# Patient Record
Sex: Female | Born: 1995 | Race: White | Hispanic: No | Marital: Married | Smoking: Current every day smoker
Health system: Southern US, Community
[De-identification: ages and names within clinical notes are randomized; demographics above are authoritative.]

## PROBLEM LIST (undated history)

## (undated) DIAGNOSIS — Z8489 Family history of other specified conditions: Secondary | ICD-10-CM

## (undated) DIAGNOSIS — F419 Anxiety disorder, unspecified: Secondary | ICD-10-CM

## (undated) DIAGNOSIS — J189 Pneumonia, unspecified organism: Secondary | ICD-10-CM

## (undated) DIAGNOSIS — K589 Irritable bowel syndrome without diarrhea: Secondary | ICD-10-CM

## (undated) DIAGNOSIS — Z973 Presence of spectacles and contact lenses: Secondary | ICD-10-CM

## (undated) DIAGNOSIS — F32A Depression, unspecified: Secondary | ICD-10-CM

## (undated) DIAGNOSIS — R112 Nausea with vomiting, unspecified: Secondary | ICD-10-CM

## (undated) DIAGNOSIS — T753XXA Motion sickness, initial encounter: Secondary | ICD-10-CM

## (undated) DIAGNOSIS — F329 Major depressive disorder, single episode, unspecified: Secondary | ICD-10-CM

## (undated) DIAGNOSIS — Z9889 Other specified postprocedural states: Secondary | ICD-10-CM

## (undated) DIAGNOSIS — G43909 Migraine, unspecified, not intractable, without status migrainosus: Secondary | ICD-10-CM

## (undated) HISTORY — DX: Anxiety disorder, unspecified: F41.9

## (undated) HISTORY — PX: WISDOM TOOTH EXTRACTION: SHX21

---
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
## 2016-10-10 DIAGNOSIS — H6063 Unspecified chronic otitis externa, bilateral: Secondary | ICD-10-CM | POA: Diagnosis not present

## 2016-10-10 DIAGNOSIS — H903 Sensorineural hearing loss, bilateral: Secondary | ICD-10-CM | POA: Diagnosis not present

## 2016-10-16 DIAGNOSIS — H5213 Myopia, bilateral: Secondary | ICD-10-CM | POA: Diagnosis not present

## 2016-10-23 DIAGNOSIS — H903 Sensorineural hearing loss, bilateral: Secondary | ICD-10-CM | POA: Diagnosis not present

## 2017-04-19 DIAGNOSIS — R197 Diarrhea, unspecified: Secondary | ICD-10-CM | POA: Diagnosis not present

## 2017-04-19 DIAGNOSIS — R1084 Generalized abdominal pain: Secondary | ICD-10-CM | POA: Diagnosis not present

## 2017-04-19 DIAGNOSIS — A049 Bacterial intestinal infection, unspecified: Secondary | ICD-10-CM | POA: Diagnosis not present

## 2017-08-01 ENCOUNTER — Ambulatory Visit (INDEPENDENT_AMBULATORY_CARE_PROVIDER_SITE_OTHER): Payer: BLUE CROSS/BLUE SHIELD | Admitting: Obstetrics and Gynecology

## 2017-08-01 ENCOUNTER — Encounter: Payer: Self-pay | Admitting: Obstetrics and Gynecology

## 2017-08-01 VITALS — BP 104/70 | HR 85 | Ht 64.0 in | Wt 145.0 lb

## 2017-08-01 DIAGNOSIS — F329 Major depressive disorder, single episode, unspecified: Secondary | ICD-10-CM

## 2017-08-01 DIAGNOSIS — Z124 Encounter for screening for malignant neoplasm of cervix: Secondary | ICD-10-CM | POA: Diagnosis not present

## 2017-08-01 DIAGNOSIS — Z01419 Encounter for gynecological examination (general) (routine) without abnormal findings: Secondary | ICD-10-CM | POA: Diagnosis not present

## 2017-08-01 DIAGNOSIS — Z113 Encounter for screening for infections with a predominantly sexual mode of transmission: Secondary | ICD-10-CM

## 2017-08-01 DIAGNOSIS — F419 Anxiety disorder, unspecified: Secondary | ICD-10-CM

## 2017-08-01 DIAGNOSIS — Z3041 Encounter for surveillance of contraceptive pills: Secondary | ICD-10-CM

## 2017-08-01 DIAGNOSIS — F32A Depression, unspecified: Secondary | ICD-10-CM

## 2017-08-01 MED ORDER — NORGESTIM-ETH ESTRAD TRIPHASIC 0.18/0.215/0.25 MG-35 MCG PO TABS: 1.0000 | ORAL_TABLET | Freq: Every day | ORAL | 4 refills | Status: DC

## 2017-08-01 MED ORDER — PAROXETINE HCL 10 MG PO TABS: ORAL_TABLET | ORAL | 1 refills | Status: DC

## 2017-08-01 NOTE — Progress Notes (Signed)
PCP:  Patient, No Pcp Per   Chief Complaint  Patient presents with  . Gynecologic Exam    birthcontol refill/discuss options  . Anxiety     HPI:      Ms. Michele Bell is a 21 y.o. No obstetric history on file. who LMP was Patient's last menstrual period was 06/01/2017., presents today for her NP annual examination.  Her menses are Q3 packs of cont dosing OCPs due to menometrorrhagia, lasting 6 days.  Dysmenorrhea moderate, occurring first 1-2 days of flow. NSAIDs with minimal relief. She does not have intermenstrual bleeding if she isn't late/doesn't miss OCPs.  Sex activity: single partner, contraception - OCP (estrogen/progesterone).  Hx of STDs: none  There is no FH of breast cancer. There is no FH of ovarian cancer. The patient does not do self-breast exams.  Tobacco use: The patient denies current or previous tobacco use. Alcohol use: glass of wine with dinner No drug use.  Exercise: moderately active  She does get adequate calcium and Vitamin D in her diet.  Gardasil unsure if done.  She has a hx of anxiety with some depression sx since high school. She has been treated with xanax and lorazepam but pt doesn't like the drug hangover effect of them. She has daily sx with frequent panic attacks and would rather have a medication to prevent sx. She notes feeling nervous, having excessive worry, irritability, feeling afraid something bad will happen, as well as depression sx of feeling down, too much sleeping. No FH anxiety/depression except in mom who has cancer. Doesn't usually have SI unless really upset during panic attacks.   Past Medical History:  Diagnosis Date  . Anxiety     History reviewed. No pertinent surgical history.  Family History  Problem Relation Age of Onset  . Cancer - Prostate Maternal Grandfather 74  . Colon cancer Other   . Lung cancer Mother 72       smoker  . Colon cancer Other     Social History   Socioeconomic History  .  Marital status: Single    Spouse name: Not on file  . Number of children: Not on file  . Years of education: Not on file  . Highest education level: Not on file  Social Needs  . Financial resource strain: Not on file  . Food insecurity - worry: Not on file  . Food insecurity - inability: Not on file  . Transportation needs - medical: Not on file  . Transportation needs - non-medical: Not on file  Occupational History  . Not on file  Tobacco Use  . Smoking status: Never Smoker  . Smokeless tobacco: Never Used  Substance and Sexual Activity  . Alcohol use: Yes  . Drug use: No  . Sexual activity: Yes    Partners: Male    Birth control/protection: Pill  Other Topics Concern  . Not on file  Social History Narrative  . Not on file    Current Meds  Medication Sig  . ALPRAZolam (XANAX) 0.25 MG tablet Take 0.25 mg at bedtime as needed by mouth for anxiety.  . Norgestimate-Ethinyl Estradiol Triphasic (TRI-SPRINTEC) 0.18/0.215/0.25 MG-35 MCG tablet Take 1 tablet daily by mouth. CONTINUOUS DOSING  . [DISCONTINUED] TRI-SPRINTEC 0.18/0.215/0.25 MG-35 MCG tablet      ROS:  Review of Systems  Constitutional: Negative for fatigue, fever and unexpected weight change.  Respiratory: Negative for cough, shortness of breath and wheezing.   Cardiovascular: Negative for chest pain, palpitations and leg  swelling.  Gastrointestinal: Negative for blood in stool, constipation, diarrhea, nausea and vomiting.  Endocrine: Negative for cold intolerance, heat intolerance and polyuria.  Genitourinary: Positive for vaginal discharge. Negative for dyspareunia, dysuria, flank pain, frequency, genital sores, hematuria, menstrual problem, pelvic pain, urgency, vaginal bleeding and vaginal pain.  Musculoskeletal: Negative for back pain, joint swelling and myalgias.  Skin: Negative for rash.  Neurological: Negative for dizziness, syncope, light-headedness, numbness and headaches.  Hematological: Negative for  adenopathy.  Psychiatric/Behavioral: Positive for agitation and dysphoric mood. Negative for confusion, sleep disturbance and suicidal ideas. The patient is nervous/anxious.      Objective: BP 104/70 (BP Location: Left Arm, Patient Position: Sitting, Cuff Size: Normal)   Pulse 85   Ht 5\' 4"  (1.626 m)   Wt 145 lb (65.8 kg)   LMP 06/01/2017   BMI 24.89 kg/m    Physical Exam  Constitutional: She is oriented to person, place, and time. She appears well-developed and well-nourished.  Genitourinary: Vagina normal and uterus normal. There is no rash or tenderness on the right labia. There is no rash or tenderness on the left labia. No erythema or tenderness in the vagina. No vaginal discharge found. Right adnexum does not display mass and does not display tenderness. Left adnexum does not display mass and does not display tenderness. Cervix does not exhibit motion tenderness or polyp. Uterus is not enlarged or tender.  Neck: Normal range of motion. No thyromegaly present.  Cardiovascular: Normal rate, regular rhythm and normal heart sounds.  No murmur heard. Pulmonary/Chest: Effort normal and breath sounds normal. Right breast exhibits no mass, no nipple discharge, no skin change and no tenderness. Left breast exhibits no mass, no nipple discharge, no skin change and no tenderness.  Abdominal: Soft. There is no tenderness. There is no guarding.  Musculoskeletal: Normal range of motion.  Neurological: She is alert and oriented to person, place, and time. No cranial nerve deficit.  Psychiatric: She has a normal mood and affect. Her behavior is normal.  Vitals reviewed.  RESULTS:  GAD-7=22 PHQ-9=14  Assessment/Plan: Encounter for annual routine gynecological examination  Cervical cancer screening - Plan: IGP,CtNgTv,rfx Aptima HPV ASCU  Screening for STD (sexually transmitted disease) - Plan: IGP,CtNgTv,rfx Aptima HPV ASCU  Encounter for surveillance of contraceptive pills - OCP RF.  -  Plan: Norgestimate-Ethinyl Estradiol Triphasic (TRI-SPRINTEC) 0.18/0.215/0.25 MG-35 MCG tablet  Anxiety and depression - Start paxil. Rx eRxd. RTO in 7 wks for f/u/sooner prn.  - Plan: PARoxetine (PAXIL) 10 MG tablet  Meds ordered this encounter  Medications  . DISCONTD: TRI-SPRINTEC 0.18/0.215/0.25 MG-35 MCG tablet    Refill:  5  . ALPRAZolam (XANAX) 0.25 MG tablet    Sig: Take 0.25 mg at bedtime as needed by mouth for anxiety.  Marland Kitchen. PARoxetine (PAXIL) 10 MG tablet    Sig: Take 1/2 tablet daily for 6 days, then 1 tablet daily    Dispense:  30 tablet    Refill:  1  . Norgestimate-Ethinyl Estradiol Triphasic (TRI-SPRINTEC) 0.18/0.215/0.25 MG-35 MCG tablet    Sig: Take 1 tablet daily by mouth. CONTINUOUS DOSING    Dispense:  3 Package    Refill:  4             GYN counsel use and side effects of OCP's, adequate intake of calcium and vitamin D, diet and exercise, as well as Gardasil handout given to pt     F/U  Return in about 7 weeks (around 09/19/2017) for anxiety f/u.  Kyrus Hyde B. Jahzier Villalon, PA-C  08/01/2017 4:21 PM

## 2017-08-01 NOTE — Patient Instructions (Signed)
I value your feedback and entrusting us with your care. If you get a [facility] patient survey, I would appreciate you taking the time to let us know about your experience today. Thank you! 

## 2017-08-06 LAB — IGP,CTNGTV,RFX APTIMA HPV ASCU
Chlamydia, Nuc. Acid Amp: NEGATIVE
Gonococcus, Nuc. Acid Amp: NEGATIVE
PAP Smear Comment: 0
Trich vag by NAA: NEGATIVE

## 2017-09-23 ENCOUNTER — Encounter: Payer: Self-pay | Admitting: Obstetrics and Gynecology

## 2017-09-23 ENCOUNTER — Ambulatory Visit: Payer: BLUE CROSS/BLUE SHIELD | Admitting: Obstetrics and Gynecology

## 2017-09-23 VITALS — BP 100/60 | HR 107 | Ht 64.0 in | Wt 146.0 lb

## 2017-09-23 DIAGNOSIS — B3731 Acute candidiasis of vulva and vagina: Secondary | ICD-10-CM

## 2017-09-23 DIAGNOSIS — B373 Candidiasis of vulva and vagina: Secondary | ICD-10-CM | POA: Diagnosis not present

## 2017-09-23 DIAGNOSIS — F329 Major depressive disorder, single episode, unspecified: Secondary | ICD-10-CM

## 2017-09-23 DIAGNOSIS — F419 Anxiety disorder, unspecified: Secondary | ICD-10-CM

## 2017-09-23 DIAGNOSIS — F32A Depression, unspecified: Secondary | ICD-10-CM

## 2017-09-23 LAB — POCT WET PREP WITH KOH
CLUE CELLS WET PREP PER HPF POC: NEGATIVE
KOH Prep POC: NEGATIVE
TRICHOMONAS UA: NEGATIVE
YEAST WET PREP PER HPF POC: POSITIVE

## 2017-09-23 MED ORDER — TERCONAZOLE 0.4 % VA CREA: 1.0000 | TOPICAL_CREAM | Freq: Every day | VAGINAL | 0 refills | Status: DC

## 2017-09-23 MED ORDER — PAROXETINE HCL 20 MG PO TABS: 20.0000 mg | ORAL_TABLET | Freq: Every day | ORAL | 0 refills | Status: DC

## 2017-09-23 NOTE — Patient Instructions (Signed)
I value your feedback and entrusting us with your care. If you get a  patient survey, I would appreciate you taking the time to let us know about your experience today. Thank you! 

## 2017-09-23 NOTE — Progress Notes (Signed)
Chief Complaint  Patient presents with  . Follow-up    HPI:      Ms. Michele Bell is a 22 y.o. [id number] who LMP was Patient's last menstrual period was 08/26/2017., presents today for anxiety/depression f/u from 11/18 annual. Pt was started on paxil 10 mg daily for panic attacks, irritability, worry, and dread. Pt states she is no longer having panic attacks. She will get extreme anxiety episodes periodically but they don't progress to a panic attack. Sx are improving but aren't resolved. Pt denies any side effects.   08/01/17 NOTE: She has a hx of anxiety with some depression sx since high school. She has been treated with xanax and lorazepam but pt doesn't like the drug hangover effect of them. She has daily sx with frequent panic attacks and would rather have a medication to prevent sx. She notes feeling nervous, having excessive worry, irritability, feeling afraid something bad will happen, as well as depression sx of feeling down, too much sleeping. No FH anxiety/depression except in mom who has cancer. Doesn't usually have SI unless really upset during panic attacks.  Pt also notes vaginal discomfort after sex in the perineal area, as well as increased d/c, mild odor. Sx for the past month. No recent abx use. No meds to treat. No new sex partners.   Past Medical History:  Diagnosis Date  . Anxiety     History reviewed. No pertinent surgical history.  Family History  Problem Relation Age of Onset  . Cancer - Prostate Maternal Grandfather [id number]  . Colon cancer Other   . Lung cancer Mother 1641       smoker  . Colon cancer Other     Social History   Socioeconomic History  . Marital status: Single    Spouse name: Not on file  . Number of children: Not on file  . Years of education: Not on file  . Highest education level: Not on file  Social Needs  . Financial resource strain: Not on file  . Food insecurity - worry: Not on file  . Food insecurity - inability: Not on  file  . Transportation needs - medical: Not on file  . Transportation needs - non-medical: Not on file  Occupational History  . Not on file  Tobacco Use  . Smoking status: Never Smoker  . Smokeless tobacco: Never Used  Substance and Sexual Activity  . Alcohol use: Yes  . Drug use: No  . Sexual activity: Yes    Partners: Male    Birth control/protection: Pill  Other Topics Concern  . Not on file  Social History Narrative  . Not on file     Current Outpatient Medications:  .  ALPRAZolam (XANAX) 0.25 MG tablet, Take 0.25 mg at bedtime as needed by mouth for anxiety., Disp: , Rfl:  .  Norgestimate-Ethinyl Estradiol Triphasic (TRI-SPRINTEC) 0.18/0.215/0.25 MG-35 MCG tablet, Take 1 tablet daily by mouth. CONTINUOUS DOSING, Disp: 3 Package, Rfl: 4 .  PARoxetine (PAXIL) 20 MG tablet, Take 1 tablet (20 mg total) by mouth daily., Disp: 30 tablet, Rfl: 0 .  terconazole (TERAZOL 7) 0.4 % vaginal cream, Place 1 applicator vaginally at bedtime., Disp: 45 g, Rfl: 0   ROS:  Review of Systems  Constitutional: Positive for fatigue. Negative for fever.  Gastrointestinal: Negative for blood in stool, constipation, diarrhea, nausea and vomiting.  Genitourinary: Positive for dyspareunia and vaginal discharge. Negative for dysuria, flank pain, frequency, hematuria, urgency, vaginal bleeding and vaginal pain.  Musculoskeletal:  Negative for back pain.  Skin: Negative for rash.  Psychiatric/Behavioral: Positive for dysphoric mood. Negative for agitation and confusion. The patient is not hyperactive.      OBJECTIVE:   Vitals:  BP 100/60   Pulse (!) 107   Ht 5\' 4"  (1.626 m)   Wt 146 lb (66.2 kg)   LMP 08/26/2017   BMI 25.06 kg/m   Physical Exam  Constitutional: She is oriented to person, place, and time and well-developed, well-nourished, and in no distress. Vital signs are normal.  Genitourinary: Vagina normal, uterus normal, cervix normal, right adnexa normal and left adnexa normal. Uterus  is not enlarged. Cervix exhibits no motion tenderness and no tenderness. Right adnexum displays no mass and no tenderness. Left adnexum displays no mass and no tenderness. Vulva exhibits erythema, lesion, rash and tenderness. Vulva exhibits no exudate. Vagina exhibits no lesion.  Genitourinary Comments: FISSURES BILAT PERINEAL AREA; ERYTHEMATOUS VESTIBULE  Neurological: She is oriented to person, place, and time.    Results: Results for orders placed or performed in visit on 09/23/17 (from the past 24 hour(s))  POCT Wet Prep with KOH     Status: Abnormal   Collection Time: 09/23/17  2:51 PM  Result Value Ref Range   Trichomonas, UA Negative    Clue Cells Wet Prep HPF POC NEG    Epithelial Wet Prep HPF POC  Few, Moderate, Many, Too numerous to count   Yeast Wet Prep HPF POC POS    Bacteria Wet Prep HPF POC  Few   RBC Wet Prep HPF POC     WBC Wet Prep HPF POC     KOH Prep POC Negative Negative   GAD-7=11 (was 22) PHQ-9=8 (was 14)  Assessment/Plan: Candidal vaginitis - Pos wet prep/exam.  Rx terazol. F/u prn. - Plan: terconazole (TERAZOL 7) 0.4 % vaginal cream, POCT Wet Prep with KOH  Anxiety and depression - Sx improved with paxil 10 mg but not resolved. No side effects. Increase to 20 mg dose. Rx eRxd. Pt to f/u via phone in 4 wks with sx.  - Plan: PARoxetine (PAXIL) 20 MG tablet    Meds ordered this encounter  Medications  . PARoxetine (PAXIL) 20 MG tablet    Sig: Take 1 tablet (20 mg total) by mouth daily.    Dispense:  30 tablet    Refill:  0  . terconazole (TERAZOL 7) 0.4 % vaginal cream    Sig: Place 1 applicator vaginally at bedtime.    Dispense:  45 g    Refill:  0      Return if symptoms worsen or fail to improve.  Michele Bell B. Toni Demo, PA-C 09/23/2017 2:53 PM

## 2017-10-24 ENCOUNTER — Other Ambulatory Visit: Payer: Self-pay | Admitting: Obstetrics and Gynecology

## 2017-10-24 ENCOUNTER — Encounter: Payer: Self-pay | Admitting: Obstetrics and Gynecology

## 2017-10-24 DIAGNOSIS — F329 Major depressive disorder, single episode, unspecified: Secondary | ICD-10-CM

## 2017-10-24 DIAGNOSIS — F32A Depression, unspecified: Secondary | ICD-10-CM

## 2017-10-24 DIAGNOSIS — F419 Anxiety disorder, unspecified: Principal | ICD-10-CM

## 2017-10-24 MED ORDER — PAROXETINE HCL 20 MG PO TABS: 20.0000 mg | ORAL_TABLET | Freq: Every day | ORAL | 2 refills | Status: DC

## 2017-10-24 NOTE — Telephone Encounter (Signed)
Please advise for refills. Thank you! 

## 2018-01-07 DIAGNOSIS — R51 Headache: Secondary | ICD-10-CM | POA: Diagnosis not present

## 2018-01-07 DIAGNOSIS — M542 Cervicalgia: Secondary | ICD-10-CM | POA: Diagnosis not present

## 2018-01-07 DIAGNOSIS — M5411 Radiculopathy, occipito-atlanto-axial region: Secondary | ICD-10-CM | POA: Diagnosis not present

## 2018-01-07 DIAGNOSIS — M5412 Radiculopathy, cervical region: Secondary | ICD-10-CM | POA: Diagnosis not present

## 2018-01-08 DIAGNOSIS — M542 Cervicalgia: Secondary | ICD-10-CM | POA: Diagnosis not present

## 2018-01-08 DIAGNOSIS — M5411 Radiculopathy, occipito-atlanto-axial region: Secondary | ICD-10-CM | POA: Diagnosis not present

## 2018-01-08 DIAGNOSIS — M5412 Radiculopathy, cervical region: Secondary | ICD-10-CM | POA: Diagnosis not present

## 2018-01-08 DIAGNOSIS — R51 Headache: Secondary | ICD-10-CM | POA: Diagnosis not present

## 2018-01-13 DIAGNOSIS — R51 Headache: Secondary | ICD-10-CM | POA: Diagnosis not present

## 2018-01-13 DIAGNOSIS — M5411 Radiculopathy, occipito-atlanto-axial region: Secondary | ICD-10-CM | POA: Diagnosis not present

## 2018-01-13 DIAGNOSIS — M542 Cervicalgia: Secondary | ICD-10-CM | POA: Diagnosis not present

## 2018-01-13 DIAGNOSIS — M5412 Radiculopathy, cervical region: Secondary | ICD-10-CM | POA: Diagnosis not present

## 2018-01-14 DIAGNOSIS — M542 Cervicalgia: Secondary | ICD-10-CM | POA: Diagnosis not present

## 2018-01-14 DIAGNOSIS — M5411 Radiculopathy, occipito-atlanto-axial region: Secondary | ICD-10-CM | POA: Diagnosis not present

## 2018-01-14 DIAGNOSIS — M5412 Radiculopathy, cervical region: Secondary | ICD-10-CM | POA: Diagnosis not present

## 2018-01-14 DIAGNOSIS — R51 Headache: Secondary | ICD-10-CM | POA: Diagnosis not present

## 2018-01-15 DIAGNOSIS — M542 Cervicalgia: Secondary | ICD-10-CM | POA: Diagnosis not present

## 2018-01-15 DIAGNOSIS — R51 Headache: Secondary | ICD-10-CM | POA: Diagnosis not present

## 2018-01-15 DIAGNOSIS — M5411 Radiculopathy, occipito-atlanto-axial region: Secondary | ICD-10-CM | POA: Diagnosis not present

## 2018-01-15 DIAGNOSIS — M5412 Radiculopathy, cervical region: Secondary | ICD-10-CM | POA: Diagnosis not present

## 2018-01-16 ENCOUNTER — Encounter: Payer: Self-pay | Admitting: Obstetrics and Gynecology

## 2018-01-16 DIAGNOSIS — H938X3 Other specified disorders of ear, bilateral: Secondary | ICD-10-CM | POA: Diagnosis not present

## 2018-01-17 ENCOUNTER — Telehealth: Payer: Self-pay | Admitting: Obstetrics and Gynecology

## 2018-01-17 DIAGNOSIS — F410 Panic disorder [episodic paroxysmal anxiety] without agoraphobia: Secondary | ICD-10-CM | POA: Diagnosis not present

## 2018-01-17 NOTE — Telephone Encounter (Signed)
Pls let pt know that since we didn't see her for sx, I can't write a note. I know that is frustrating for pt, but she'll just have to explain to work that she wasn't able to be seen by PCP and that minute clinic note will have to do. Sorry.

## 2018-01-17 NOTE — Telephone Encounter (Signed)
LVM for patient to call me back 

## 2018-01-17 NOTE — Telephone Encounter (Signed)
Patient is calling needing an work note. Please advise. Patient states she sent message thru mychart directly to [employer].

## 2018-01-20 DIAGNOSIS — M5411 Radiculopathy, occipito-atlanto-axial region: Secondary | ICD-10-CM | POA: Diagnosis not present

## 2018-01-20 DIAGNOSIS — R51 Headache: Secondary | ICD-10-CM | POA: Diagnosis not present

## 2018-01-20 DIAGNOSIS — M5412 Radiculopathy, cervical region: Secondary | ICD-10-CM | POA: Diagnosis not present

## 2018-01-20 DIAGNOSIS — M542 Cervicalgia: Secondary | ICD-10-CM | POA: Diagnosis not present

## 2018-01-21 DIAGNOSIS — M5411 Radiculopathy, occipito-atlanto-axial region: Secondary | ICD-10-CM | POA: Diagnosis not present

## 2018-01-21 DIAGNOSIS — M5412 Radiculopathy, cervical region: Secondary | ICD-10-CM | POA: Diagnosis not present

## 2018-01-21 DIAGNOSIS — R51 Headache: Secondary | ICD-10-CM | POA: Diagnosis not present

## 2018-01-21 DIAGNOSIS — M542 Cervicalgia: Secondary | ICD-10-CM | POA: Diagnosis not present

## 2018-01-27 DIAGNOSIS — M5411 Radiculopathy, occipito-atlanto-axial region: Secondary | ICD-10-CM | POA: Diagnosis not present

## 2018-01-27 DIAGNOSIS — M5412 Radiculopathy, cervical region: Secondary | ICD-10-CM | POA: Diagnosis not present

## 2018-01-27 DIAGNOSIS — M542 Cervicalgia: Secondary | ICD-10-CM | POA: Diagnosis not present

## 2018-01-27 DIAGNOSIS — R51 Headache: Secondary | ICD-10-CM | POA: Diagnosis not present

## 2018-01-29 DIAGNOSIS — M5411 Radiculopathy, occipito-atlanto-axial region: Secondary | ICD-10-CM | POA: Diagnosis not present

## 2018-01-29 DIAGNOSIS — R51 Headache: Secondary | ICD-10-CM | POA: Diagnosis not present

## 2018-01-29 DIAGNOSIS — M5412 Radiculopathy, cervical region: Secondary | ICD-10-CM | POA: Diagnosis not present

## 2018-01-29 DIAGNOSIS — M542 Cervicalgia: Secondary | ICD-10-CM | POA: Diagnosis not present

## 2018-02-03 DIAGNOSIS — M5411 Radiculopathy, occipito-atlanto-axial region: Secondary | ICD-10-CM | POA: Diagnosis not present

## 2018-02-03 DIAGNOSIS — R51 Headache: Secondary | ICD-10-CM | POA: Diagnosis not present

## 2018-02-03 DIAGNOSIS — M5412 Radiculopathy, cervical region: Secondary | ICD-10-CM | POA: Diagnosis not present

## 2018-02-03 DIAGNOSIS — M542 Cervicalgia: Secondary | ICD-10-CM | POA: Diagnosis not present

## 2018-02-04 DIAGNOSIS — M5411 Radiculopathy, occipito-atlanto-axial region: Secondary | ICD-10-CM | POA: Diagnosis not present

## 2018-02-04 DIAGNOSIS — M5412 Radiculopathy, cervical region: Secondary | ICD-10-CM | POA: Diagnosis not present

## 2018-02-04 DIAGNOSIS — M542 Cervicalgia: Secondary | ICD-10-CM | POA: Diagnosis not present

## 2018-02-04 DIAGNOSIS — R51 Headache: Secondary | ICD-10-CM | POA: Diagnosis not present

## 2018-07-10 ENCOUNTER — Encounter: Payer: Self-pay | Admitting: Obstetrics and Gynecology

## 2018-07-10 ENCOUNTER — Other Ambulatory Visit: Payer: Self-pay | Admitting: Obstetrics and Gynecology

## 2018-07-10 DIAGNOSIS — F419 Anxiety disorder, unspecified: Secondary | ICD-10-CM

## 2018-07-10 DIAGNOSIS — F32A Depression, unspecified: Secondary | ICD-10-CM

## 2018-07-10 DIAGNOSIS — F329 Major depressive disorder, single episode, unspecified: Secondary | ICD-10-CM

## 2018-07-10 DIAGNOSIS — Z3041 Encounter for surveillance of contraceptive pills: Secondary | ICD-10-CM

## 2018-07-10 MED ORDER — PAROXETINE HCL 20 MG PO TABS: 20.0000 mg | ORAL_TABLET | Freq: Every day | ORAL | 0 refills | Status: DC

## 2018-07-10 MED ORDER — NORGESTIM-ETH ESTRAD TRIPHASIC 0.18/0.215/0.25 MG-35 MCG PO TABS: 1.0000 | ORAL_TABLET | Freq: Every day | ORAL | 0 refills | Status: DC

## 2018-07-10 NOTE — Progress Notes (Signed)
Rx RF till annual. Moved to [place].

## 2018-08-13 DIAGNOSIS — R232 Flushing: Secondary | ICD-10-CM | POA: Diagnosis not present

## 2018-08-13 DIAGNOSIS — R61 Generalized hyperhidrosis: Secondary | ICD-10-CM | POA: Diagnosis not present

## 2018-08-13 DIAGNOSIS — Z131 Encounter for screening for diabetes mellitus: Secondary | ICD-10-CM | POA: Diagnosis not present

## 2018-08-13 DIAGNOSIS — Z113 Encounter for screening for infections with a predominantly sexual mode of transmission: Secondary | ICD-10-CM | POA: Diagnosis not present

## 2018-08-13 DIAGNOSIS — F321 Major depressive disorder, single episode, moderate: Secondary | ICD-10-CM | POA: Diagnosis not present

## 2018-08-13 DIAGNOSIS — E669 Obesity, unspecified: Secondary | ICD-10-CM | POA: Diagnosis not present

## 2018-08-13 DIAGNOSIS — Z3009 Encounter for other general counseling and advice on contraception: Secondary | ICD-10-CM | POA: Diagnosis not present

## 2018-08-13 DIAGNOSIS — Z0000 Encounter for general adult medical examination without abnormal findings: Secondary | ICD-10-CM | POA: Diagnosis not present

## 2018-08-19 DIAGNOSIS — J029 Acute pharyngitis, unspecified: Secondary | ICD-10-CM | POA: Diagnosis not present

## 2018-08-29 DIAGNOSIS — F321 Major depressive disorder, single episode, moderate: Secondary | ICD-10-CM | POA: Diagnosis not present

## 2018-08-29 DIAGNOSIS — Z0000 Encounter for general adult medical examination without abnormal findings: Secondary | ICD-10-CM | POA: Diagnosis not present

## 2018-08-29 DIAGNOSIS — E663 Overweight: Secondary | ICD-10-CM | POA: Diagnosis not present

## 2018-08-29 DIAGNOSIS — Z3041 Encounter for surveillance of contraceptive pills: Secondary | ICD-10-CM | POA: Diagnosis not present

## 2018-09-13 DIAGNOSIS — S92505A Nondisplaced unspecified fracture of left lesser toe(s), initial encounter for closed fracture: Secondary | ICD-10-CM | POA: Diagnosis not present

## 2018-09-18 DIAGNOSIS — F410 Panic disorder [episodic paroxysmal anxiety] without agoraphobia: Secondary | ICD-10-CM | POA: Diagnosis not present

## 2018-10-02 DIAGNOSIS — F410 Panic disorder [episodic paroxysmal anxiety] without agoraphobia: Secondary | ICD-10-CM | POA: Diagnosis not present

## 2018-10-16 DIAGNOSIS — F410 Panic disorder [episodic paroxysmal anxiety] without agoraphobia: Secondary | ICD-10-CM | POA: Diagnosis not present

## 2018-10-18 DIAGNOSIS — R6883 Chills (without fever): Secondary | ICD-10-CM | POA: Diagnosis not present

## 2018-10-18 DIAGNOSIS — B9789 Other viral agents as the cause of diseases classified elsewhere: Secondary | ICD-10-CM | POA: Diagnosis not present

## 2018-10-18 DIAGNOSIS — J069 Acute upper respiratory infection, unspecified: Secondary | ICD-10-CM | POA: Diagnosis not present

## 2018-10-18 DIAGNOSIS — R110 Nausea: Secondary | ICD-10-CM | POA: Diagnosis not present

## 2018-11-07 ENCOUNTER — Emergency Department
Admission: EM | Admit: 2018-11-07 | Discharge: 2018-11-08 | Disposition: A | Discharge status: Home / Self Care | Payer: BLUE CROSS/BLUE SHIELD | Attending: Emergency Medicine | Admitting: Emergency Medicine

## 2018-11-07 ENCOUNTER — Encounter: Payer: Self-pay | Admitting: Emergency Medicine

## 2018-11-07 ENCOUNTER — Emergency Department: Payer: BLUE CROSS/BLUE SHIELD

## 2018-11-07 DIAGNOSIS — F419 Anxiety disorder, unspecified: Secondary | ICD-10-CM | POA: Diagnosis not present

## 2018-11-07 DIAGNOSIS — T07XXXA Unspecified multiple injuries, initial encounter: Secondary | ICD-10-CM | POA: Diagnosis not present

## 2018-11-07 DIAGNOSIS — Y929 Unspecified place or not applicable: Secondary | ICD-10-CM | POA: Diagnosis not present

## 2018-11-07 DIAGNOSIS — F17290 Nicotine dependence, other tobacco product, uncomplicated: Secondary | ICD-10-CM | POA: Insufficient documentation

## 2018-11-07 DIAGNOSIS — S9032XA Contusion of left foot, initial encounter: Secondary | ICD-10-CM | POA: Diagnosis not present

## 2018-11-07 DIAGNOSIS — Y999 Unspecified external cause status: Secondary | ICD-10-CM | POA: Insufficient documentation

## 2018-11-07 DIAGNOSIS — W07XXXA Fall from chair, initial encounter: Secondary | ICD-10-CM | POA: Diagnosis not present

## 2018-11-07 DIAGNOSIS — S5002XA Contusion of left elbow, initial encounter: Secondary | ICD-10-CM | POA: Diagnosis not present

## 2018-11-07 DIAGNOSIS — M545 Low back pain, unspecified: Secondary | ICD-10-CM

## 2018-11-07 DIAGNOSIS — S99912A Unspecified injury of left ankle, initial encounter: Secondary | ICD-10-CM | POA: Diagnosis not present

## 2018-11-07 DIAGNOSIS — S0990XA Unspecified injury of head, initial encounter: Secondary | ICD-10-CM | POA: Diagnosis not present

## 2018-11-07 DIAGNOSIS — S59902A Unspecified injury of left elbow, initial encounter: Secondary | ICD-10-CM | POA: Diagnosis not present

## 2018-11-07 DIAGNOSIS — W19XXXA Unspecified fall, initial encounter: Secondary | ICD-10-CM

## 2018-11-07 DIAGNOSIS — R270 Ataxia, unspecified: Secondary | ICD-10-CM | POA: Diagnosis not present

## 2018-11-07 DIAGNOSIS — I10 Essential (primary) hypertension: Secondary | ICD-10-CM | POA: Diagnosis not present

## 2018-11-07 DIAGNOSIS — S4992XA Unspecified injury of left shoulder and upper arm, initial encounter: Secondary | ICD-10-CM | POA: Diagnosis not present

## 2018-11-07 DIAGNOSIS — Y9389 Activity, other specified: Secondary | ICD-10-CM | POA: Diagnosis not present

## 2018-11-07 DIAGNOSIS — S3992XA Unspecified injury of lower back, initial encounter: Secondary | ICD-10-CM | POA: Diagnosis not present

## 2018-11-07 DIAGNOSIS — S40012A Contusion of left shoulder, initial encounter: Secondary | ICD-10-CM | POA: Diagnosis not present

## 2018-11-07 DIAGNOSIS — R52 Pain, unspecified: Secondary | ICD-10-CM | POA: Diagnosis not present

## 2018-11-07 DIAGNOSIS — S7002XA Contusion of left hip, initial encounter: Secondary | ICD-10-CM | POA: Diagnosis not present

## 2018-11-07 DIAGNOSIS — S79912A Unspecified injury of left hip, initial encounter: Secondary | ICD-10-CM | POA: Diagnosis not present

## 2018-11-07 DIAGNOSIS — S299XXA Unspecified injury of thorax, initial encounter: Secondary | ICD-10-CM | POA: Diagnosis not present

## 2018-11-07 DIAGNOSIS — R0902 Hypoxemia: Secondary | ICD-10-CM | POA: Diagnosis not present

## 2018-11-07 LAB — POCT PREGNANCY, URINE: Preg Test, Ur: NEGATIVE

## 2018-11-07 MED ORDER — LORAZEPAM 1 MG PO TABS
1.0000 mg | ORAL_TABLET | Freq: Once | ORAL | Status: AC
  Administered 2018-11-07: 1 mg via ORAL
  Filled 2018-11-07: qty 1

## 2018-11-07 MED ORDER — ACETAMINOPHEN 500 MG PO TABS
1000.0000 mg | ORAL_TABLET | Freq: Once | ORAL | Status: AC
  Administered 2018-11-07: 1000 mg via ORAL
  Filled 2018-11-07: qty 2

## 2018-11-07 NOTE — ED Provider Notes (Signed)
[facility] Emergency Department Provider Note  ____________________________________________  Time seen: Approximately 10:49 PM  I have reviewed the triage vital signs and the nursing notes.   HISTORY  Chief Complaint Fall    HPI Michele Bell is a 23 y.o. female with a history of depression and anxiety presenting with multiple areas of pain after a fall.  The patient was painting while standing on a chair when she fell.  She does not remember the whole fall although she is not sure if she had loss of consciousness.  At this time, she reports pain in the left shoulder, left elbow, left hip and left heel, as well as low back.  She does not think she hit her head and has not had any nausea or vomiting, no headache.  She does not have any numbness tingling or weakness.  She is feeling very anxious.  Past Medical History:  Diagnosis Date  . Anxiety     There are no active problems to display for this patient.   History reviewed. No pertinent surgical history.  Current Outpatient Rx  . Order #: [id number] Class: Historical Med  . Order #: [id number] Class: Normal  . Order #: [id number] Class: Normal  . Order #: [id number] Class: Normal    Allergies Percocet [oxycodone-acetaminophen]  Family History  Problem Relation Age of Onset  . Cancer - Prostate Maternal Grandfather 66  . Colon cancer Other   . Lung cancer Mother 39       smoker  . Colon cancer Other     Social History Social History   Tobacco Use  . Smoking status: Current Every Day Smoker    Types: E-cigarettes  . Smokeless tobacco: Never Used  Substance Use Topics  . Alcohol use: Yes  . Drug use: No    Review of Systems Constitutional: No fever/chills.  No lightheadedness or syncope.  Positive mechanical fall with possible loss of consciousness. Eyes: No visual changes.  No blurred or double vision. ENT: No sore throat. No congestion or rhinorrhea. Cardiovascular: Denies chest  pain. Denies palpitations. Respiratory: Denies shortness of breath.  No cough. Gastrointestinal: No abdominal pain.  No nausea, no vomiting.  No diarrhea.  No constipation. Genitourinary: Negative for dysuria.   Musculoskeletal: No neck pain.  Positive low back pain.  Positive left shoulder pain, left elbow pain, left heel pain, and left hip pain. Skin: Negative for rash. Neurological: Negative for headaches. No focal numbness, tingling or weakness.  Psychiatric:Anxiety.   ____________________________________________   PHYSICAL EXAM:  VITAL SIGNS: ED Triage Vitals  Enc Vitals Group     BP 11/07/18 2233 109/72     Pulse Rate 11/07/18 2233 93     Resp 11/07/18 2233 18     Temp 11/07/18 2233 97.7 F (36.5 C)     Temp src --      SpO2 11/07/18 2233 100 %     Weight 11/07/18 2225 170 lb (77.1 kg)     Height 11/07/18 2225 5\' 3"  (1.6 m)     Head Circumference --      Peak Flow --      Pain Score 11/07/18 2225 4     Pain Loc --      Pain Edu? --      Excl. in GC? --     Constitutional: Alert and oriented.  Answers questions appropriately.  Patient is anxious, and crying.  GCS is 15. Eyes: Conjunctivae are normal.  EOMI. No scleral icterus.  No raccoon eyes. Head:  Atraumatic.  No battle sign. Nose: No congestion/rhinnorhea.  Swelling over the nose received septal hematoma. Mouth/Throat: Mucous membranes are moist.  Dental injury or malocclusion. Neck: No stridor.  Supple.  Patient is in a collar.  She has no midline C-spine tenderness to palpation, step-offs or deformities.  She has full range of motion without pain.  I have clinically cleared her from the collar. Cardiovascular: Normal rate, regular rhythm. No murmurs, rubs or gallops.  Respiratory: Normal respiratory effort.  No accessory muscle use or retractions. Lungs CTAB.  No wheezes, rales or ronchi. Gastrointestinal: Soft, nontender and nondistended.  No guarding or rebound.  No peritoneal signs. Musculoskeletal: Pelvis  is stable.  No deformities on the extremities.  Pain with range of motion of the left shoulder, left elbow, left heel and left hip.  Normal left radial, left DP and PT pulses bilaterally.  The patient is full range of motion of the left knee, left wrist without pain.  The right-sided extremities are reassuring.  No thoracic spine tenderness to palpation, step-offs or deformities.  The patient has pain just above the sacrum in the lower lumbar spine without palpable step-offs or deformities. Neurologic:  A&Ox3.  Speech is clear.  Face and smile are symmetric.  EOMI.  Moves all extremities well.  Normal sensation to light touch. Skin:  Skin is warm, dry and intact. No rash noted. Psychiatric: Anxious mood and affect.  ____________________________________________   LABS (all labs ordered are listed, but only abnormal results are displayed)  Labs Reviewed  POCT PREGNANCY, URINE   ____________________________________________  EKG  Not indicated ____________________________________________  RADIOLOGY  Ct Head Wo Contrast  Result Date: 11/07/2018 CLINICAL DATA:  23 y/o  F; ataxia and head trauma. EXAM: CT HEAD WITHOUT CONTRAST TECHNIQUE: Contiguous axial images were obtained from the base of the skull through the vertex without intravenous contrast. COMPARISON:  None. FINDINGS: Brain: No evidence of acute infarction, hemorrhage, hydrocephalus, extra-axial collection or mass lesion/mass effect. Vascular: No hyperdense vessel or unexpected calcification. Skull: Normal. Negative for fracture or focal lesion. Sinuses/Orbits: No acute finding. Other: None. IMPRESSION: Normal head CT. Electronically Signed   By: Mitzi Hansen M.D.   On: 11/07/2018 23:16    ____________________________________________   PROCEDURES  Procedure(s) performed: None  Procedures  Critical Care performed: No ____________________________________________   INITIAL IMPRESSION / ASSESSMENT AND PLAN / ED  COURSE  Pertinent labs & imaging results that were available during my care of the patient were reviewed by me and considered in my medical decision making (see chart for details).  23 y.o. female status post fall with multiple areas of pain.  We will get a CT of the head since she is not sure whether she had loss of consciousness.  In addition, will have x-rays of the left shoulder, left elbow, left hip, left foot for evaluation of her heel pain, and lumbar spine.  The patient will be given treatment for her anxiety and pain, and reevaluated for final disposition.  The patient's imaging has been signed out to Dr. Manson Passey, who will disposition the patient.  ____________________________________________  FINAL CLINICAL IMPRESSION(S) / ED DIAGNOSES  Final diagnoses:  Fall, initial encounter  Multiple contusions  Acute midline low back pain without sciatica  Anxiety         NEW MEDICATIONS STARTED DURING THIS VISIT:  New Prescriptions   No medications on file      Rockne Menghini, MD 11/07/18 2324

## 2018-11-07 NOTE — Discharge Instructions (Signed)
Take Tylenol or Motrin for your pain.  You may also use a heating pad, or an ice pack, for your pain; you may apply it for 15 minutes every 2 hours.  Return to the emergency department if you develop severe pain, numbness tingling or weakness, inability to walk, vomiting, changes in mental status, or any other symptoms concerning to you.

## 2018-11-07 NOTE — ED Triage Notes (Signed)
Patient fell off of a chair backward and c/o left shoulder, clavicle, elbow, rib, flank, and hip pain post fall. No obvious deformities. 50 mcg of fentanyl given at approx 2208 in route to ED

## 2018-11-08 MED ORDER — KETOROLAC TROMETHAMINE 30 MG/ML IJ SOLN
30.0000 mg | Freq: Once | INTRAMUSCULAR | Status: AC
  Administered 2018-11-08: 30 mg via INTRAVENOUS
  Filled 2018-11-08: qty 1

## 2018-11-08 NOTE — ED Provider Notes (Signed)
I assumed care of the patient from Dr. Sharma Covert at 11:00 PM.  Recommendation that if all imaging negative patient was cleared to be discharged home.  I reviewed all of the patient's imaging that was performed which were all negative no fracture dislocation plain films, no intracranial abnormality noted on CT head.  Patient and family informed of negative x-ray and CT scan findings.  Patient will be discharged home   Darci Current, MD 11/08/18 [phone]

## 2018-11-08 NOTE — ED Notes (Signed)
This RN reviewed discharge instructions, follow-up care, OTC pain relievers, cryotherapy, and use of heat with pateint. Patient verbalized understanding of all reviewed information.  Patient stable, with no distress noted at this time.

## 2018-12-03 DIAGNOSIS — R509 Fever, unspecified: Secondary | ICD-10-CM | POA: Diagnosis not present

## 2018-12-03 DIAGNOSIS — J0300 Acute streptococcal tonsillitis, unspecified: Secondary | ICD-10-CM | POA: Diagnosis not present

## 2019-01-19 DIAGNOSIS — Z3202 Encounter for pregnancy test, result negative: Secondary | ICD-10-CM | POA: Diagnosis not present

## 2019-01-19 DIAGNOSIS — R1031 Right lower quadrant pain: Secondary | ICD-10-CM | POA: Diagnosis not present

## 2019-02-17 DIAGNOSIS — Z20828 Contact with and (suspected) exposure to other viral communicable diseases: Secondary | ICD-10-CM | POA: Diagnosis not present

## 2019-04-19 DIAGNOSIS — Z20828 Contact with and (suspected) exposure to other viral communicable diseases: Secondary | ICD-10-CM | POA: Diagnosis not present

## 2019-05-12 DIAGNOSIS — R1031 Right lower quadrant pain: Secondary | ICD-10-CM | POA: Diagnosis not present

## 2019-05-12 DIAGNOSIS — Z5181 Encounter for therapeutic drug level monitoring: Secondary | ICD-10-CM | POA: Diagnosis not present

## 2019-05-12 DIAGNOSIS — Z79899 Other long term (current) drug therapy: Secondary | ICD-10-CM | POA: Diagnosis not present

## 2019-05-13 DIAGNOSIS — R1031 Right lower quadrant pain: Secondary | ICD-10-CM | POA: Diagnosis not present

## 2019-05-14 DIAGNOSIS — R1031 Right lower quadrant pain: Secondary | ICD-10-CM | POA: Diagnosis not present

## 2019-05-18 ENCOUNTER — Other Ambulatory Visit: Payer: Self-pay

## 2019-05-18 DIAGNOSIS — K5900 Constipation, unspecified: Secondary | ICD-10-CM | POA: Insufficient documentation

## 2019-05-18 DIAGNOSIS — Z79899 Other long term (current) drug therapy: Secondary | ICD-10-CM | POA: Insufficient documentation

## 2019-05-18 DIAGNOSIS — F17290 Nicotine dependence, other tobacco product, uncomplicated: Secondary | ICD-10-CM | POA: Insufficient documentation

## 2019-05-18 DIAGNOSIS — R1084 Generalized abdominal pain: Secondary | ICD-10-CM | POA: Diagnosis present

## 2019-05-18 DIAGNOSIS — R69 Illness, unspecified: Secondary | ICD-10-CM | POA: Diagnosis not present

## 2019-05-18 LAB — COMPREHENSIVE METABOLIC PANEL
ALT: 12 U/L (ref 0–44)
AST: 16 U/L (ref 15–41)
Albumin: 4.7 g/dL (ref 3.5–5.0)
Alkaline Phosphatase: 55 U/L (ref 38–126)
Anion gap: 10 (ref 5–15)
BUN: 7 mg/dL (ref 6–20)
CO2: 23 mmol/L (ref 22–32)
Calcium: 9.4 mg/dL (ref 8.9–10.3)
Chloride: 105 mmol/L (ref 98–111)
Creatinine, Ser: 0.61 mg/dL (ref 0.44–1.00)
GFR calc Af Amer: >60 mL/min (ref >60)
GFR calc non Af Amer: >60 mL/min (ref >60)
Glucose, Bld: 92 mg/dL (ref 70–99)
Potassium: 4 mmol/L (ref 3.5–5.1)
Sodium: 138 mmol/L (ref 135–145)
Total Bilirubin: 0.4 mg/dL (ref 0.3–1.2)
Total Protein: 8.3 g/dL — ABNORMAL HIGH (ref 6.5–8.1)

## 2019-05-18 LAB — CBC
HCT: 42.3 % (ref 36.0–46.0)
Hemoglobin: 13.9 g/dL (ref 12.0–15.0)
MCH: 27.7 pg (ref 26.0–34.0)
MCHC: 32.9 g/dL (ref 30.0–36.0)
MCV: 84.3 fL (ref 80.0–100.0)
Platelets: 336 10*3/uL (ref 150–400)
RBC: 5.02 MIL/uL (ref 3.87–5.11)
RDW: 14.5 % (ref 11.5–15.5)
WBC: 12.3 10*3/uL — ABNORMAL HIGH (ref 4.0–10.5)
nRBC: 0 % (ref 0.0–0.2)

## 2019-05-18 LAB — LIPASE, BLOOD: Lipase: 32 U/L (ref 11–51)

## 2019-05-18 MED ORDER — SODIUM CHLORIDE 0.9% FLUSH: 3.0000 mL | Freq: Once | INTRAVENOUS | Status: DC

## 2019-05-18 NOTE — ED Triage Notes (Signed)
Pt comes via POV from home with c/o abdominal pain and constipation. Pt states she has not had a BM in 5-6 days. Pt states she has tried several different medications and enemas with no relief.  Pt states pain to right side. Pt states some nausea.

## 2019-05-19 ENCOUNTER — Emergency Department
Admission: EM | Admit: 2019-05-19 | Discharge: 2019-05-19 | Disposition: A | Discharge status: Home / Self Care | Payer: 59 | Attending: Emergency Medicine | Admitting: Emergency Medicine

## 2019-05-19 DIAGNOSIS — R1084 Generalized abdominal pain: Secondary | ICD-10-CM

## 2019-05-19 DIAGNOSIS — K5900 Constipation, unspecified: Secondary | ICD-10-CM

## 2019-05-19 LAB — URINALYSIS, COMPLETE (UACMP) WITH MICROSCOPIC
Bacteria, UA: NONE SEEN
Bilirubin Urine: NEGATIVE
Glucose, UA: NEGATIVE mg/dL
Hgb urine dipstick: NEGATIVE
Ketones, ur: 5 mg/dL — AB
Leukocytes,Ua: NEGATIVE
Nitrite: NEGATIVE
Protein, ur: NEGATIVE mg/dL
Specific Gravity, Urine: 1.026 (ref 1.005–1.030)
pH: 5 (ref 5.0–8.0)

## 2019-05-19 LAB — PREGNANCY, URINE: Preg Test, Ur: NEGATIVE

## 2019-05-19 MED ORDER — LACTULOSE 10 GM/15ML PO SOLN
30.0000 g | Freq: Once | ORAL | Status: AC
  Administered 2019-05-19: 30 g via ORAL
  Filled 2019-05-19: qty 60

## 2019-05-19 NOTE — Discharge Instructions (Addendum)
You were seen in the emergency department today for constipation.  Please read through the information we provided about colonoscopy prep.  Try it tomorrow and it should have the results you need.  Alternatively, or on a chronic basis to avoid this happening again, we recommend that you use one or more of the following over-the-counter medications in the order described:   1)  Colace (or Dulcolax) 100 mg:  This is a stool softener, and you may take it once or twice a day as needed. 2)  Senna tablets:  This is a bowel stimulant that will help "push" out your stool. It is the next step to add after you have tried a stool softener. 3)  Miralax (powder):  This medication works by drawing additional fluid into your intestines and helps to flush out your stool.  Mix the powder with water or juice according to label instructions.  It may help if the Colace and Senna are not sufficient, but you must be sure to use the recommended amount of water or juice when you mix up the powder. 4)  Look for magnesium citrate at the pharmacy (it is usually a small glass bottle).  Drink the bottle according to the label instructions.  Remember that narcotic pain medications are constipating, so avoid them or minimize their use.  Drink plenty of fluids.  Please return to the Emergency Department immediately if you develop new or worsening symptoms that concern you, such as (but not limited to) fever > 101 degrees, severe abdominal pain, or persistent vomiting.

## 2019-05-19 NOTE — ED Notes (Signed)
Patient went [facility] er Tuesday and Wednesday. Patient was told was constipated. Pt has been doing enemas since being discharge and still unable to have BM  except for brown colored water. Patient states having abdominal pain in middle of belly that is radiating to flanks.

## 2019-05-19 NOTE — ED Provider Notes (Addendum)
[facility] Emergency Department Provider Note  ____________________________________________   First MD Initiated Contact with Patient 05/19/19 0037     (approximate)  I have reviewed the triage vital signs and the nursing notes.   HISTORY  Chief Complaint Constipation and Abdominal Pain    HPI Michele Bell is a 23 y.o. female with medical history as listed below who presents for evaluation of generalized abdominal pain and constipation.  She has been seen [facility] emergency department twice in the last week for the same issue.  She has had 2 CT scans and a pelvic ultrasound which did not identify any emergent issue but constipation.  They recommended that she eat a high-fiber diet, drink plenty water, use MiraLAX, and use enemas, and she says she has been doing so but they have not helped.  Her symptoms have been gradually getting worse over time.  She describes them as severe.  He has gotten a little bit of stool out but not much.  Nothing in particular seems to make her symptoms better or worse.  She denies contact with COVID-19 patients.  She denies fever/chills, sore throat, chest pain, cough, shortness of breath, nausea, and vomiting.  She does not use narcotics.         Past Medical History:  Diagnosis Date  . Anxiety     There are no active problems to display for this patient.   History reviewed. No pertinent surgical history.  Prior to Admission medications   Medication Sig Start Date End Date Taking? Authorizing Provider  ALPRAZolam Prudy Feeler(XANAX) 0.25 MG tablet Take 0.25 mg at bedtime as needed by mouth for anxiety.    [provider]  Norgestimate-Ethinyl Estradiol Triphasic (TRI-SPRINTEC) 0.18/0.215/0.25 MG-35 MCG tablet Take 1 tablet by mouth daily. CONTINUOUS DOSING 07/10/18   Copland, Ilona SorrelAlicia B, PA-C  PARoxetine (PAXIL) 20 MG tablet Take 1 tablet (20 mg total) by mouth daily. 07/10/18   Copland, Ilona SorrelAlicia B, PA-C   terconazole (TERAZOL 7) 0.4 % vaginal cream Place 1 applicator vaginally at bedtime. 09/23/17   Copland, Ilona SorrelAlicia B, PA-C    Allergies Percocet [oxycodone-acetaminophen]  Family History  Problem Relation Age of Onset  . Cancer - Prostate Maternal Grandfather 5269  . Colon cancer Other   . Lung cancer Mother 6941       smoker  . Colon cancer Other     Social History Social History   Tobacco Use  . Smoking status: Current Every Day Smoker    Types: E-cigarettes  . Smokeless tobacco: Never Used  Substance Use Topics  . Alcohol use: Yes  . Drug use: No    Review of Systems Constitutional: No fever/chills Eyes: No visual changes. ENT: No sore throat. Cardiovascular: Denies chest pain. Respiratory: Denies shortness of breath. Gastrointestinal: Constipation and generalized abdominal pain.  No nausea nor vomiting. Genitourinary: Negative for dysuria. Musculoskeletal: Negative for neck pain.  Negative for back pain. Integumentary: Negative for rash. Neurological: Negative for headaches, focal weakness or numbness.   ____________________________________________   PHYSICAL EXAM:  VITAL SIGNS: ED Triage Vitals [05/18/19 2052]  Enc Vitals Group     BP 128/80     Pulse Rate 91     Resp 18     Temp 98.5 F (36.9 C)     Temp src      SpO2 97 %     Weight 68 kg (150 lb)     Height 1.575 m (5\' 2" )     Head Circumference  Peak Flow      Pain Score 7     Pain Loc      Pain Edu?      Excl. in GC?     Constitutional: Alert and oriented.  Generally well-appearing and in no acute distress. Eyes: Conjunctivae are normal.  Head: Atraumatic. Nose: No congestion/rhinnorhea. Mouth/Throat: Mucous membranes are moist. Neck: No stridor.  No meningeal signs.   Cardiovascular: Normal rate, regular rhythm. Good peripheral circulation. Grossly normal heart sounds. Respiratory: Normal respiratory effort.  No retractions. Gastrointestinal: Soft and nontender. No distention at this  time.  Rectal exam deferred. Musculoskeletal: No lower extremity tenderness nor edema. No gross deformities of extremities. Neurologic:  Normal speech and language. No gross focal neurologic deficits are appreciated.  Skin:  Skin is warm, dry and intact. Psychiatric: Mood and affect are normal. Speech and behavior are normal.  ____________________________________________   LABS (all labs ordered are listed, but only abnormal results are displayed)  Labs Reviewed  COMPREHENSIVE METABOLIC PANEL - Abnormal; Notable for the following components:      Result Value   Total Protein 8.3 (*)    All other components within normal limits  CBC - Abnormal; Notable for the following components:   WBC 12.3 (*)    All other components within normal limits  URINALYSIS, COMPLETE (UACMP) WITH MICROSCOPIC - Abnormal; Notable for the following components:   Color, Urine YELLOW (*)    APPearance HAZY (*)    Ketones, ur 5 (*)    All other components within normal limits  LIPASE, BLOOD  PREGNANCY, URINE   ____________________________________________  EKG  None - EKG not ordered by ED physician ____________________________________________  RADIOLOGY Marylou Mccoy, personally viewed and evaluated these images (plain radiographs) as part of my medical decision making, as well as reviewing the written report by the radiologist.  ED MD interpretation: No emergent imaging required tonight.  Official radiology report(s): No results found.  ____________________________________________   PROCEDURES   Procedure(s) performed (including Critical Care):  Procedures   ____________________________________________   INITIAL IMPRESSION / MDM / ASSESSMENT AND PLAN / ED COURSE  As part of my medical decision making, I reviewed the following data within the electronic MEDICAL RECORD NUMBER Nursing notes reviewed and incorporated, Labs reviewed , Old chart reviewed, Notes from prior ED visits and [place]  Controlled Substance Database   Differential diagnosis includes, but is not limited to, nonspecific constipation, SBO/ileus, other acute intra-abdominal infection.  I reviewed the medical records from [facility] and she had a very thorough work-up including, as described above, 2 CT scans and a pelvic ultrasound.  She was evaluated personally by surgery as well.  It was determined that her symptoms are due to constipation as far back as the a sending colon.  I think that is why the enemas are not being particular successful, because of constipation is too proximal in her colon.  We discussed her symptoms for a little bit and I gave her my recommendations.  I am giving her a dose of lactulose 30 g by mouth just prior to discharge.  I gave her the bowel prep instructions provided by [facility] pediatric gastroenterology that uses over-the-counter MiraLAX and bisacodyl tablets.  I also gave my other usual constipation guidelines.  She understands and will try these methods at home.  I gave my usual customary return precautions      Clinical Course as of May 18 144  Tue May 19, 2019  0145 I forgot to noted above, but  her lab work is generally reassuring with only mild and nonspecific leukocytosis and a normal comprehensive metabolic panel including no significantly abnormalities.  Minimal ketones on urine.  Vital signs are stable.   [CF]    Clinical Course User Index [CF] Hinda Kehr, MD     ____________________________________________  FINAL CLINICAL IMPRESSION(S) / ED DIAGNOSES  Final diagnoses:  Constipation, unspecified constipation type  Generalized abdominal pain     MEDICATIONS GIVEN DURING THIS VISIT:  Medications  lactulose (CHRONULAC) 10 GM/15ML solution 30 g (has no administration in time range)     ED Discharge Orders    None      *Please note:  ELDANA ISIP was evaluated in Emergency Department on 05/19/2019 for the symptoms described in the history of present  illness. She was evaluated in the context of the global COVID-19 pandemic, which necessitated consideration that the patient might be at risk for infection with the SARS-CoV-2 virus that causes COVID-19. Institutional protocols and algorithms that pertain to the evaluation of patients at risk for COVID-19 are in a state of rapid change based on information released by regulatory bodies including the CDC and federal and state organizations. These policies and algorithms were followed during the patient's care in the ED.  Some ED evaluations and interventions may be delayed as a result of limited staffing during the pandemic.*  Note:  This document was prepared using Dragon voice recognition software and may include unintentional dictation errors.   Hinda Kehr, MD 05/19/19 0109    Hinda Kehr, MD 05/19/19 [phone]

## 2019-07-09 ENCOUNTER — Other Ambulatory Visit: Payer: Self-pay

## 2019-07-09 ENCOUNTER — Ambulatory Visit (INDEPENDENT_AMBULATORY_CARE_PROVIDER_SITE_OTHER): Payer: 59 | Admitting: Gastroenterology

## 2019-07-09 ENCOUNTER — Encounter: Payer: Self-pay | Admitting: Gastroenterology

## 2019-07-09 VITALS — BP 110/78 | HR 75 | Temp 98.1°F | Ht 64.0 in | Wt 146.8 lb

## 2019-07-09 DIAGNOSIS — K5900 Constipation, unspecified: Secondary | ICD-10-CM

## 2019-07-09 DIAGNOSIS — R194 Change in bowel habit: Secondary | ICD-10-CM | POA: Diagnosis not present

## 2019-07-09 DIAGNOSIS — R634 Abnormal weight loss: Secondary | ICD-10-CM | POA: Diagnosis not present

## 2019-07-09 NOTE — Progress Notes (Signed)
[name] MD, MRCP(U.K) [address]  [address]  [place], [place] [address]  Main: [phone]  Fax: [phone]   Gastroenterology Consultation  Referring Provider:   ER  primary Care Physician:  Patient, No Pcp Per Primary Gastroenterologist:  Dr. [name]  Reason for Consultation:     Abdominal pain        HPI:   Michele Bell is a 23 y.o. y/o female referred for dental pain  Initially seen at the ER on 05/12/2019 for abdominal pain right lower quadrant at [facility].  Per the ER note she underwent a CT scan of the abdomen and pelvis which was normal.  Pelvic exam was normal.  Normal pelvic ultrasound.  CBC, CMP, urinalysis, pregnancy tests, lipase were all normal.  She returned to the ER a day later 2 of constipation.  Was discharged home on MiraLAX.  On 05/19/2019 she presented to the [facility] ER with abdominal pain and constipation.  She was given some lactulose and discharged.  She says that she has not had issues with constipation in the past but over the last few weeks has had issues with bowel movements.  She recalls that when she went to the ER she not had a bowel movement for over 4 days and the pain that gradually built up all over her abdomen.  Subsequently she was given a Gatorade MiraLAX bowel prep and after emptying out her bowels she felt significantly better.  Since then the constipation has recurred and she has had on and off abdominal discomfort related to not having a bowel movement.  Her diet is poor in fiber and she says she just cannot eat  fruits and vegetables.  No family history of colon cancer or polyps.  Recently had a rectal bleeding when she passed very hard stool.  She has lost about 30 pounds of weight since March this year.  She says she feels really hungry but dislikes the taste of foods at times.  Past Medical History:  Diagnosis Date  . Anxiety     No past surgical history on file.  Prior to Admission  medications   Medication Sig Start Date End Date Taking? Authorizing Provider  ALPRAZolam Prudy Feeler) 0.25 MG tablet Take 0.25 mg at bedtime as needed by mouth for anxiety.    [provider]  Norgestimate-Ethinyl Estradiol Triphasic (TRI-SPRINTEC) 0.18/0.215/0.25 MG-35 MCG tablet Take 1 tablet by mouth daily. CONTINUOUS DOSING 07/10/18   Copland, Ilona Sorrel, PA-C  PARoxetine (PAXIL) 20 MG tablet Take 1 tablet (20 mg total) by mouth daily. 07/10/18   Copland, Ilona Sorrel, PA-C  terconazole (TERAZOL 7) 0.4 % vaginal cream Place 1 applicator vaginally at bedtime. 09/23/17   Copland, Ilona Sorrel, PA-C    Family History  Problem Relation Age of Onset  . Cancer - Prostate Maternal Grandfather 70  . Colon cancer Other   . Lung cancer Mother 20       smoker  . Colon cancer Other      Social History   Tobacco Use  . Smoking status: Current Every Day Smoker    Types: E-cigarettes  . Smokeless tobacco: Never Used  Substance Use Topics  . Alcohol use: Yes  . Drug use: No    Allergies as of 07/09/2019 - Review Complete 05/18/2019  Allergen Reaction Noted  . Percocet [oxycodone-acetaminophen] Rash 08/01/2017    Review of Systems:    All systems reviewed and negative except where noted in HPI.   Physical  Exam:  There were no vitals taken for this visit. No LMP recorded. Psych:  Alert and cooperative. Normal mood and affect. General:   Alert,  Well-developed, well-nourished, pleasant and cooperative in NAD Head:  Normocephalic and atraumatic. Eyes:  Sclera clear, no icterus.   Conjunctiva pink. Ears:  Normal auditory acuity. Nose:  No deformity, discharge, or lesions. Mouth:  No deformity or lesions,oropharynx pink & moist. Neck:  Supple; no masses or thyromegaly. Lungs:  Respirations even and unlabored.  Clear throughout to auscultation.   No wheezes, crackles, or rhonchi. No acute distress. Heart:  Regular rate and rhythm; no murmurs, clicks, rubs, or gallops. Abdomen:  Normal bowel  sounds.  No bruits.  Soft, non-tender and non-distended without masses, hepatosplenomegaly or hernias noted.  No guarding or rebound tenderness.    Neurologic:  Alert and oriented x3;  grossly normal neurologically. Skin:  Intact without significant lesions or rashes. No jaundice. Lymph Nodes:  No significant cervical adenopathy. Psych:  Alert and cooperative. Normal mood and affect.  Imaging Studies: No results found.  Assessment and Plan:   Michele Bell is a 23 y.o. y/o female has been referred for abdominal pain.  Her history is suggestive of irritable bowel syndrome with constipation but has been ongoing for less than 6 months.  Unclear why this is began all of a sudden which means that it is a change in her bowel habits.  She has lost some weight and it is unclear if this is related to the abdominal discomfort preventing her from eating or if it is a secondary process.  Plan 1.  Commence on Linzess 145 mcg once a day.  I have advised her to inform me in a couple of days if it does or does not work to titrate the dose appropriately. 2.  Colonoscopy to evaluate change in bowel habits. 3.  TSH 4.  Provide samples of boost. 5.  She says that she is trying to get pregnant and I have checked on up-to-date and it says that Linzess is not known to affect the fetus  I have discussed alternative options, risks & benefits,  which include, but are not limited to, bleeding, infection, perforation,respiratory complication & drug reaction.  The patient agrees with this plan & written consent will be obtained.     Follow up in 3 to 4 weeks  Dr [name] MD,MRCP(U.K)

## 2019-07-10 ENCOUNTER — Encounter: Payer: Self-pay | Admitting: Gastroenterology

## 2019-07-10 ENCOUNTER — Other Ambulatory Visit
Admission: RE | Admit: 2019-07-10 | Discharge: 2019-07-10 | Disposition: A | Discharge status: Home / Self Care | Payer: 59 | Source: Ambulatory Visit | Attending: Gastroenterology | Admitting: Gastroenterology

## 2019-07-10 DIAGNOSIS — Z20828 Contact with and (suspected) exposure to other viral communicable diseases: Secondary | ICD-10-CM | POA: Insufficient documentation

## 2019-07-10 DIAGNOSIS — Z01812 Encounter for preprocedural laboratory examination: Secondary | ICD-10-CM | POA: Diagnosis not present

## 2019-07-10 LAB — SARS CORONAVIRUS 2 (TAT 6-24 HRS): SARS Coronavirus 2: NEGATIVE

## 2019-07-10 LAB — TSH: TSH: 2.38 u[IU]/mL (ref 0.450–4.500)

## 2019-07-14 ENCOUNTER — Ambulatory Visit: Payer: 59 | Admitting: Anesthesiology

## 2019-07-14 ENCOUNTER — Encounter: Payer: Self-pay | Admitting: *Deleted

## 2019-07-14 ENCOUNTER — Encounter
Admission: RE | Disposition: A | Discharge status: Home / Self Care | Payer: Self-pay | Source: Home / Self Care | Attending: Gastroenterology

## 2019-07-14 ENCOUNTER — Ambulatory Visit
Admission: RE | Admit: 2019-07-14 | Discharge: 2019-07-14 | Disposition: A | Discharge status: Home / Self Care | Payer: 59 | Attending: Gastroenterology | Admitting: Gastroenterology

## 2019-07-14 DIAGNOSIS — K5900 Constipation, unspecified: Secondary | ICD-10-CM

## 2019-07-14 DIAGNOSIS — F17290 Nicotine dependence, other tobacco product, uncomplicated: Secondary | ICD-10-CM | POA: Diagnosis not present

## 2019-07-14 DIAGNOSIS — F419 Anxiety disorder, unspecified: Secondary | ICD-10-CM | POA: Diagnosis not present

## 2019-07-14 DIAGNOSIS — F329 Major depressive disorder, single episode, unspecified: Secondary | ICD-10-CM | POA: Diagnosis not present

## 2019-07-14 DIAGNOSIS — Z885 Allergy status to narcotic agent status: Secondary | ICD-10-CM | POA: Diagnosis not present

## 2019-07-14 DIAGNOSIS — Z79899 Other long term (current) drug therapy: Secondary | ICD-10-CM | POA: Insufficient documentation

## 2019-07-14 DIAGNOSIS — R194 Change in bowel habit: Secondary | ICD-10-CM | POA: Insufficient documentation

## 2019-07-14 DIAGNOSIS — R69 Illness, unspecified: Secondary | ICD-10-CM | POA: Diagnosis not present

## 2019-07-14 HISTORY — DX: Depression, unspecified: F32.A

## 2019-07-14 HISTORY — PX: COLONOSCOPY WITH PROPOFOL: SHX5780

## 2019-07-14 LAB — POCT PREGNANCY, URINE: Preg Test, Ur: NEGATIVE

## 2019-07-14 SURGERY — COLONOSCOPY WITH PROPOFOL
Anesthesia: General

## 2019-07-14 MED ORDER — LIDOCAINE HCL (CARDIAC) PF 100 MG/5ML IV SOSY
PREFILLED_SYRINGE | INTRAVENOUS | Status: DC | PRN
  Administered 2019-07-14: 60 mg via INTRAVENOUS

## 2019-07-14 MED ORDER — PROPOFOL 500 MG/50ML IV EMUL
INTRAVENOUS | Status: DC | PRN
  Administered 2019-07-14: 125 ug/kg/min via INTRAVENOUS

## 2019-07-14 MED ORDER — MIDAZOLAM HCL 2 MG/2ML IJ SOLN
INTRAMUSCULAR | Status: DC | PRN
  Administered 2019-07-14: 2 mg via INTRAVENOUS

## 2019-07-14 MED ORDER — PROPOFOL 500 MG/50ML IV EMUL
INTRAVENOUS | Status: AC
  Filled 2019-07-14: qty 50

## 2019-07-14 MED ORDER — PROPOFOL 10 MG/ML IV BOLUS
INTRAVENOUS | Status: DC | PRN
  Administered 2019-07-14: 50 mg via INTRAVENOUS

## 2019-07-14 MED ORDER — LIDOCAINE HCL (PF) 2 % IJ SOLN
INTRAMUSCULAR | Status: AC
  Filled 2019-07-14: qty 10

## 2019-07-14 MED ORDER — MIDAZOLAM HCL 2 MG/2ML IJ SOLN
INTRAMUSCULAR | Status: AC
  Filled 2019-07-14: qty 2

## 2019-07-14 MED ORDER — SODIUM CHLORIDE 0.9 % IV SOLN
INTRAVENOUS | Status: DC
  Administered 2019-07-14: 08:00:00 via INTRAVENOUS

## 2019-07-14 NOTE — Op Note (Addendum)
[facility] Patient Name: Michele Bell Procedure Date: 07/14/2019 9:39 AM MRN: [id number] Account #: [id number] Date of Birth: 1996-05-20 Admit Type: Outpatient Age: 23 Room: [facility] ENDO ROOM 1 Gender: Female Note Status: Finalized Procedure:            Colonoscopy Indications:          Change in bowel habits Providers:            [name] MD, MD Medicines:            Monitored Anesthesia Care Complications:        No immediate complications. Procedure:            Pre-Anesthesia Assessment:                       - Prior to the procedure, a History and Physical was                        performed, and patient medications, allergies and                        sensitivities were reviewed. The patient's tolerance of                        previous anesthesia was reviewed.                       - The risks and benefits of the procedure and the                        sedation options and risks were discussed with the                        patient. All questions were answered and informed                        consent was obtained.                       - ASA Grade Assessment: II - A patient with mild                        systemic disease.                       After obtaining informed consent, the colonoscope was                        passed under direct vision. Throughout the procedure,                        the patient's blood pressure, pulse, and oxygen                        saturations were monitored continuously. The                        Colonoscope was introduced through the anus and                        advanced to the the cecum, identified by the  appendiceal orifice. The colonoscopy was performed with                        ease. The patient tolerated the procedure well. The                        quality of the bowel preparation was excellent. Findings:      The perianal and digital rectal  examinations were normal.      The entire examined colon appeared normal on direct and retroflexion       views. Impression:           - The entire examined colon is normal on direct and                        retroflexion views.                       - No specimens collected. Recommendation:       - Discharge patient to home (with escort).                       - Resume previous diet.                       - Continue present medications.                       - Return to my office as previously scheduled. Procedure Code(s):    --- Professional ---                       [phone], Colonoscopy, flexible; diagnostic, including                        collection of specimen(s) by brushing or washing, when                        performed (separate procedure) Diagnosis Code(s):    --- Professional ---                       R19.4, Change in bowel habit CPT copyright 2019 American Medical Association. All rights reserved. The codes documented in this report are preliminary and upon coder review may  be revised to meet current compliance requirements. [name], MD [name] MD, MD 07/14/2019 10:01:17 AM This report has been signed electronically. Number of Addenda: 0 Note Initiated On: 07/14/2019 9:39 AM Scope Withdrawal Time: 0 hours 9 minutes 51 seconds  Total Procedure Duration: 0 hours 11 minutes 29 seconds  Estimated Blood Loss: Estimated blood loss: none.      [facility]

## 2019-07-14 NOTE — Anesthesia Preprocedure Evaluation (Signed)
Anesthesia Evaluation  Patient identified by MRN, date of birth, ID band Patient awake    Reviewed: Allergy & Precautions, NPO status , Patient's Chart, lab work & pertinent test results  Airway Mallampati: II  TM Distance: >3 FB     Dental  (+) Teeth Intact   Pulmonary Current Smoker,    Pulmonary exam normal        Cardiovascular negative cardio ROS Normal cardiovascular exam     Neuro/Psych PSYCHIATRIC DISORDERS Anxiety Depression negative neurological ROS     GI/Hepatic negative GI ROS, Neg liver ROS,   Endo/Other  negative endocrine ROS  Renal/GU negative Renal ROS  negative genitourinary   Musculoskeletal negative musculoskeletal ROS (+)   Abdominal Normal abdominal exam  (+)   Peds negative pediatric ROS (+)  Hematology negative hematology ROS (+)   Anesthesia Other Findings   Reproductive/Obstetrics negative OB ROS                             Anesthesia Physical Anesthesia Plan  ASA: II  Anesthesia Plan: General   Post-op Pain Management:    Induction: Intravenous  PONV Risk Score and Plan: Propofol infusion  Airway Management Planned: Nasal Cannula  Additional Equipment:   Intra-op Plan:   Post-operative Plan:   Informed Consent: I have reviewed the patients History and Physical, chart, labs and discussed the procedure including the risks, benefits and alternatives for the proposed anesthesia with the patient or authorized representative who has indicated his/her understanding and acceptance.     Dental advisory given  Plan Discussed with: CRNA and Surgeon  Anesthesia Plan Comments:         Anesthesia Quick Evaluation

## 2019-07-14 NOTE — H&P (Signed)
[name], MD [address], [place], [place], [place], [address] [address], [place], [place], [place], [address] Phone: [phone]  Fax: [phone]  Primary Care Physician:  [name], PA-C   Pre-Procedure History & Physical: HPI:  Michele Bell is a 23 y.o. female is here for an colonoscopy.   Past Medical History:  Diagnosis Date  . Anxiety   . Depression     Past Surgical History:  Procedure Laterality Date  . WISDOM TOOTH EXTRACTION      Prior to Admission medications   Medication Sig Start Date End Date Taking? Authorizing Provider  linaclotide (LINZESS) 145 MCG CAPS capsule Take 145 mcg by mouth daily before breakfast.   Yes [provider]  ALPRAZolam (XANAX) 0.25 MG tablet Take 0.25 mg at bedtime as needed by mouth for anxiety.    [provider]  Norgestimate-Ethinyl Estradiol Triphasic (TRI-SPRINTEC) 0.18/0.215/0.25 MG-35 MCG tablet Take 1 tablet by mouth daily. CONTINUOUS DOSING Patient not taking: Reported on 07/09/2019 85/46/27   Copland, Deirdre Evener, PA-C  PARoxetine (PAXIL) 20 MG tablet Take 1 tablet (20 mg total) by mouth daily. Patient not taking: Reported on 07/09/2019 03/50/09   Copland, Deirdre Evener, PA-C  terconazole (TERAZOL 7) 0.4 % vaginal cream Place 1 applicator vaginally at bedtime. Patient not taking: Reported on 07/09/2019 11/22/16   Copland, Alicia B, PA-C    Allergies as of 07/09/2019 - Review Complete 07/09/2019  Allergen Reaction Noted  . Hydrocodone-acetaminophen Rash 09/22/2014  . Percocet [oxycodone-acetaminophen] Rash 08/01/2017    Family History  Problem Relation Age of Onset  . Cancer - Prostate Maternal Grandfather 17  . Colon cancer Other   . Lung cancer Mother 67       smoker  . Colon cancer Other     Social History   Socioeconomic History  . Marital status: Single    Spouse name: Not on file  . Number of children: Not on file  . Years of education: Not on file   . Highest education level: Not on file  Occupational History  . Not on file  Social Needs  . Financial resource strain: Not on file  . Food insecurity    Worry: Not on file    Inability: Not on file  . Transportation needs    Medical: Not on file    Non-medical: Not on file  Tobacco Use  . Smoking status: Current Every Day Smoker    Types: E-cigarettes  . Smokeless tobacco: Never Used  Substance and Sexual Activity  . Alcohol use: Not Currently  . Drug use: Yes    Types: Marijuana    Comment: daily for anxiety   . Sexual activity: Yes    Partners: Male    Birth control/protection: Pill  Lifestyle  . Physical activity    Days per week: Not on file    Minutes per session: Not on file  . Stress: Not on file  Relationships  . Social Herbalist on phone: Not on file    Gets together: Not on file    Attends religious service: Not on file    Active member of club or organization: Not on file    Attends meetings of clubs or organizations: Not on file    Relationship status: Not on file  . Intimate partner violence    Fear of current or ex partner: Not on file    Emotionally abused: Not  on file    Physically abused: Not on file    Forced sexual activity: Not on file  Other Topics Concern  . Not on file  Social History Narrative  . Not on file    Review of Systems: See HPI, otherwise negative ROS  Physical Exam: BP 103/79   Pulse 95   Temp 97.8 F (36.6 C) (Tympanic)   Resp 16   Ht 5\' 4"  (1.626 m)   Wt 65.8 kg   SpO2 100%   BMI 24.89 kg/m  General:   Alert,  pleasant and cooperative in NAD Head:  Normocephalic and atraumatic. Neck:  Supple; no masses or thyromegaly. Lungs:  Clear throughout to auscultation, normal respiratory effort.    Heart:  +S1, +S2, Regular rate and rhythm, No edema. Abdomen:  Soft, nontender and nondistended. Normal bowel sounds, without guarding, and without rebound.   Neurologic:  Alert and  oriented x4;  grossly normal  neurologically.  Impression/Plan: Michele Bell is here for an colonoscopy to be performed for evaluation of change in bowel habits Risks, benefits, limitations, and alternatives regarding  colonoscopy have been reviewed with the patient.  Questions have been answered.  All parties agreeable.   Kathalene Frames, MD  07/14/2019, 9:38 AM

## 2019-07-14 NOTE — Transfer of Care (Signed)
Immediate Anesthesia Transfer of Care Note  Patient: Michele Bell  Procedure(s) Performed: COLONOSCOPY WITH PROPOFOL (N/A )  Patient Location: PACU and Endoscopy Unit  Anesthesia Type:General  Level of Consciousness: sedated  Airway & Oxygen Therapy: Patient Spontanous Breathing and Patient connected to nasal cannula oxygen  Post-op Assessment: Report given to RN and Post -op Vital signs reviewed and stable  Post vital signs: Reviewed and stable  Last Vitals:  Vitals Value Taken Time  BP 90/57 07/14/19 1006  Temp 36.4 C 07/14/19 1002  Pulse 61 07/14/19 1007  Resp 27 07/14/19 1007  SpO2 100 % 07/14/19 1007  Vitals shown include unvalidated device data.  Last Pain:  Vitals:   07/14/19 1002  TempSrc: Tympanic  PainSc: Asleep         Complications: No apparent anesthesia complications

## 2019-07-14 NOTE — Anesthesia Postprocedure Evaluation (Signed)
Anesthesia Post Note  Patient: Michele Bell  Procedure(s) Performed: COLONOSCOPY WITH PROPOFOL (N/A )  Patient location during evaluation: PACU Anesthesia Type: General Level of consciousness: awake and alert Pain management: pain level controlled Vital Signs Assessment: post-procedure vital signs reviewed and stable Respiratory status: spontaneous breathing, nonlabored ventilation and respiratory function stable Cardiovascular status: blood pressure returned to baseline and stable Postop Assessment: no apparent nausea or vomiting Anesthetic complications: no     Last Vitals:  Vitals:   07/14/19 1012 07/14/19 1022  BP: 107/82 96/66  Pulse: (!) 56   Resp: (!) 23   Temp:    SpO2: 100%     Last Pain:  Vitals:   07/14/19 1022  TempSrc:   PainSc: 0-No pain                 Durenda Hurt

## 2019-07-14 NOTE — Anesthesia Post-op Follow-up Note (Signed)
Anesthesia QCDR form completed.        

## 2019-07-15 ENCOUNTER — Encounter: Payer: Self-pay | Admitting: Gastroenterology

## 2019-07-23 ENCOUNTER — Ambulatory Visit: Payer: Self-pay | Admitting: Physician Assistant

## 2019-07-24 ENCOUNTER — Encounter: Payer: Self-pay | Admitting: Physician Assistant

## 2019-07-24 ENCOUNTER — Ambulatory Visit (INDEPENDENT_AMBULATORY_CARE_PROVIDER_SITE_OTHER): Payer: 59 | Admitting: Physician Assistant

## 2019-07-24 ENCOUNTER — Other Ambulatory Visit: Payer: Self-pay

## 2019-07-24 VITALS — BP 119/84 | HR 100 | Temp 97.1°F | Ht 63.0 in | Wt 149.0 lb

## 2019-07-24 DIAGNOSIS — G4700 Insomnia, unspecified: Secondary | ICD-10-CM

## 2019-07-24 DIAGNOSIS — K5900 Constipation, unspecified: Secondary | ICD-10-CM

## 2019-07-24 DIAGNOSIS — F419 Anxiety disorder, unspecified: Secondary | ICD-10-CM | POA: Diagnosis not present

## 2019-07-24 DIAGNOSIS — F329 Major depressive disorder, single episode, unspecified: Secondary | ICD-10-CM

## 2019-07-24 DIAGNOSIS — R69 Illness, unspecified: Secondary | ICD-10-CM | POA: Diagnosis not present

## 2019-07-24 DIAGNOSIS — F32A Depression, unspecified: Secondary | ICD-10-CM

## 2019-07-24 MED ORDER — SERTRALINE HCL 50 MG PO TABS: 50.0000 mg | ORAL_TABLET | Freq: Every day | ORAL | 0 refills | Status: DC

## 2019-07-24 MED ORDER — TRAZODONE HCL 50 MG PO TABS: 25.0000 mg | ORAL_TABLET | Freq: Every evening | ORAL | 0 refills | Status: DC | PRN

## 2019-07-24 NOTE — Patient Instructions (Signed)
Depression Screening Depression screening is a tool that your health care provider can use to learn if you have symptoms of depression. Depression is a common condition with many symptoms that are also often found in other conditions. Depression is treatable, but it must first be diagnosed. You may not know that certain feelings, thoughts, and behaviors that you are having can be symptoms of depression. Taking a depression screening test can help you and your health care provider decide if you need more assessment, or if you should be referred to a mental health care provider. What are the screening tests?  You may have a physical exam to see if another condition is affecting your mental health. You may have a blood or urine sample taken during the physical exam.  You may be interviewed using a screening tool that was developed from research, such as one of these: ? Patient Health Questionnaire (PHQ). This is a set of either 2 or 9 questions. A health care provider who has been trained to score this screening test uses a guide to assess if your symptoms suggest that you may have depression. ? Hamilton Depression Rating Scale (HAM-D). This is a set of either 17 or 24 questions. You may be asked to take it again during or after your treatment, to see if your depression has gotten better. ? Beck Depression Inventory (BDI). This is a set of 21 multiple choice questions. Your health care provider scores your answers to assess:  Your level of depression, ranging from mild to severe.  Your response to treatment.  Your health care provider may talk with you about your daily activities, such as eating, sleeping, work, and recreation, and ask if you have had any changes in activity.  Your health care provider may ask you to see a mental health specialist, such as a psychiatrist or psychologist, for more evaluation. Who should be screened for depression?   All adults, including adults with a family history  of a mental health disorder.  Adolescents who are 12-18 years old.  People who are recovering from a myocardial infarction (MI).  Pregnant women, or women who have given birth.  People who have a long-term (chronic) illness.  Anyone who has been diagnosed with another type of a mental health disorder.  Anyone who has symptoms that could show depression. What do my results mean? Your health care provider will review the results of your depression screening, physical exam, and lab tests. Positive screens suggest that you may have depression. Screening is the first step in getting the care that you may need. It is up to you to get your screening results. Ask your health care provider, or the department that is doing your screening tests, when your results will be ready. Talk with your health care provider about your results and diagnosis. A diagnosis of depression is made using the Diagnostic and Statistical Manual of Mental Disorders (DSM-V). This is a book that lists the number and type of symptoms that must be present for a health care provider to give a specific diagnosis.  Your health care provider may work with you to treat your symptoms of depression, or your health care provider may help you find a mental health provider who can assess, diagnose, and treat your depression. Get help right away if:  You have thoughts about hurting yourself or others. If you ever feel like you may hurt yourself or others, or have thoughts about taking your own life, get help right away. You   can go to your nearest emergency department or call:  Your local emergency services (911 in the [place]  A suicide crisis helpline, such as the National Suicide Prevention Lifeline at 1-800-273-8255. This is open 24 hours a day. Summary  Depression screening is the first step in getting the help that you may need.  If your screening test shows symptoms of depression (is positive), your health care provider may ask  you to see a mental health provider.  Anyone who is age 12 or older should be screened for depression. This information is not intended to replace advice given to you by your health care provider. Make sure you discuss any questions you have with your health care provider. Document Released: 01/18/2017 Document Revised: 08/16/2017 Document Reviewed: 01/18/2017 Elsevier Patient Education  2020 Elsevier Inc.  

## 2019-07-24 NOTE — Progress Notes (Signed)
Patient: Michele Bell, Female    DOB: 1995-10-25, 23 y.o.   MRN: [id number] Visit Date: 07/30/2019  Today's Provider: Trey Sailors, PA-C   Chief Complaint  Patient presents with  . New Patient (Initial Visit)   Subjective:    New Patient Appointment Michele Bell is a 23 y.o. female who presents today for new patient appointment. She feels well. She reports exercising. She reports she is sleeping poorly.  Moving back to [place], [place] from [place]. Originally from [place]. Working in [place], [place]. 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 Got a house with a fiance in [place], [place].   Constipation: sees Dr. Tobi Bastos at [facility] and most recent colonoscopy on 07/14/2019 was normal.   Previously seeing psychiatrist and was on wellbutrin which was stopped during previous pregnancy scare. She realized she wanted to try for pregnancy and discontinued wellbutrin. No longer seeing psychiatrist or therapist.   She had been struggling more with depression and anxiety. She cites low motivation, especially for things like cleaning her house. Has nicknamed her bedroom the depression den. She is hesitant to restart medication because she is actively trying to get pregnant.   Last pap:08/01/2018 and was done by  [facility], [facility] - Stefanie Carbon, [place]     -----------------------------------------------------------------   Review of Systems  Constitutional: Negative.   HENT: Negative.   Eyes: Negative.   Respiratory: Negative.   Cardiovascular: Negative.   Gastrointestinal: Negative.   Endocrine: Negative.   Genitourinary: Negative.   Musculoskeletal: Negative.   Skin: Negative.   Allergic/Immunologic: Negative.   Neurological: Negative.   Hematological: Negative.   Psychiatric/Behavioral: Negative.     Social History She  reports that she has been smoking e-cigarettes. She has never used smokeless tobacco.  She reports current alcohol use. She reports current drug use. Drug: Marijuana. Social History   Socioeconomic History  . Marital status: Single    Spouse name: Not on file  . Number of children: Not on file  . Years of education: Not on file  . Highest education level: Not on file  Occupational History  . Not on file  Social Needs  . Financial resource strain: Not on file  . Food insecurity    Worry: Not on file    Inability: Not on file  . Transportation needs    Medical: Not on file    Non-medical: Not on file  Tobacco Use  . Smoking status: Current Every Day Smoker    Types: E-cigarettes  . Smokeless tobacco: Never Used  Substance and Sexual Activity  . Alcohol use: Yes  . Drug use: Yes    Types: Marijuana    Comment: daily for anxiety   . Sexual activity: Yes    Partners: Male    Birth control/protection: Pill  Lifestyle  . Physical activity    Days per week: Not on file    Minutes per session: Not on file  . Stress: Not on file  Relationships  . Social Musician on phone: Not on file    Gets together: Not on file    Attends religious service: Not on file    Active member of club or organization: Not on file    Attends meetings of clubs or organizations: Not on file    Relationship status: Not on file  Other Topics Concern  . Not on file  Social History Narrative  . Not  on file    There are no active problems to display for this patient.   Past Surgical History:  Procedure Laterality Date  . COLONOSCOPY WITH PROPOFOL N/A 07/14/2019   Procedure: COLONOSCOPY WITH PROPOFOL;  Surgeon: Wyline MoodAnna, Kiran, MD;  [facility];  Service: Gastroenterology;  Laterality: N/A;  . WISDOM TOOTH EXTRACTION      Family History  Family Status  Relation Name Status  . MGF  (Not Specified)  . Other MGGF Deceased  . Mother  (Not Specified)  . Other PGGF (Not Specified)   Her family history includes Cancer - Prostate (age of onset: 7169) in her  maternal grandfather; Colon cancer in some other family members; Lung cancer (age of onset: 4641) in her mother.     Allergies  Allergen Reactions  . Hydrocodone-Acetaminophen Rash  . Percocet [Oxycodone-Acetaminophen] Rash    Previous Medications   LINACLOTIDE (LINZESS) 145 MCG CAPS CAPSULE    Take 145 mcg by mouth daily before breakfast.    Patient Care Team: Maryella ShiversPollak, Ajee Heasley M, PA-C as PCP - General (Physician Assistant)      Objective:   Vitals: BP 119/84 (BP Location: Left Arm, Patient Position: Sitting, Cuff Size: Normal)   Pulse 100   Temp (!) 97.1 F (36.2 C) (Temporal)   Ht 5\' 3"  (1.6 m)   Wt 149 lb (67.6 kg)   LMP 06/26/2019 (Exact Date)   BMI 26.39 kg/m    Physical Exam Constitutional:      Appearance: Normal appearance.  Cardiovascular:     Rate and Rhythm: Normal rate and regular rhythm.     Heart sounds: Normal heart sounds.  Pulmonary:     Effort: Pulmonary effort is normal.     Breath sounds: Normal breath sounds.  Abdominal:     General: Bowel sounds are normal.     Palpations: Abdomen is soft.  Skin:    General: Skin is warm and dry.  Neurological:     Mental Status: She is alert and oriented to person, place, and time. Mental status is at baseline.  Psychiatric:        Mood and Affect: Mood normal.        Behavior: Behavior normal.      Depression Screen PHQ 2/9 Scores 07/24/2019  PHQ - 2 Score 3  PHQ- 9 Score 14      Assessment & Plan:     Routine Health Maintenance and Physical Exam  Exercise Activities and Dietary recommendations Goals   None      There is no immunization history on file for this patient.  Health Maintenance  Topic Date Due  . PAP-Cervical Cytology Screening  12/08/2016  . INFLUENZA VACCINE  04/18/2019  . HIV Screening  07/23/2020 (Originally 12/09/2010)  . PAP SMEAR-Modifier  08/01/2020  . TETANUS/TDAP  11/27/2027     Discussed health benefits of physical activity, and encouraged her to engage in  regular exercise appropriate for her age and condition.    1. Constipation, unspecified constipation type  Counseled some forms of constipation are "functional" meaning there are not physical findings on exam. Counseled there are in fact medications to treat this that the GI doctor will likely review at next visit.  Requesting PAP smear from previous provider.  2. Anxiety  Counseled this is OK to be on during pregnancy. Will start as below and f/u in 4-6 weeks.  - sertraline (ZOLOFT) 50 MG tablet; Take 1 tablet (50 mg total) by mouth daily.  Dispense: 90 tablet; Refill:  0 - Ambulatory referral to Chronic Care Management Services  3. Depression, unspecified depression type  - sertraline (ZOLOFT) 50 MG tablet; Take 1 tablet (50 mg total) by mouth daily.  Dispense: 90 tablet; Refill: 0 - Ambulatory referral to Chronic Care Management Services  4. Insomnia, unspecified type  - traZODone (DESYREL) 50 MG tablet; Take 0.5-1 tablets (25-50 mg total) by mouth at bedtime as needed for sleep.  Dispense: 30 tablet; Refill: 0  The entirety of the information documented in the History of Present Illness, Review of Systems and Physical Exam were personally obtained by me. Portions of this information were initially documented by [facility], CMA and reviewed by me for thoroughness and accuracy.      --------------------------------------------------------------------

## 2019-07-28 ENCOUNTER — Encounter: Payer: Self-pay | Admitting: *Deleted

## 2019-07-28 ENCOUNTER — Ambulatory Visit: Payer: Self-pay | Admitting: *Deleted

## 2019-07-28 NOTE — Chronic Care Management (AMB) (Signed)
   Care Management   Unsuccessful Call Note 07/28/2019 Name: Michele Bell MRN: [id number] DOB: [birth year]  Patient is a 23 year old female who sees Carles Collet, [place] for primary care. Carles Collet, PA-C asked the CCM team to consult the patient for mental health counseling and resources.  Referral was placed 07/24/19. Patient's last office visit was 07/24/19.     This social worker was unable to reach patient via telephone today for initial assessment. I have left HIPAA compliant voicemail asking patient to return my call. (unsuccessful outreach #1).   Plan: Will follow-up within 7 business days via telephone.      Elliot Gurney, [place] Worker  [facility] Care Management [phone]

## 2019-07-28 NOTE — Progress Notes (Signed)
This encounter was created in error - please disregard.

## 2019-07-30 ENCOUNTER — Encounter: Payer: Self-pay | Admitting: Physician Assistant

## 2019-07-31 ENCOUNTER — Encounter: Payer: Self-pay | Admitting: Physician Assistant

## 2019-07-31 ENCOUNTER — Telehealth: Payer: Self-pay | Admitting: Physician Assistant

## 2019-07-31 NOTE — Telephone Encounter (Signed)
Error

## 2019-08-04 ENCOUNTER — Ambulatory Visit (INDEPENDENT_AMBULATORY_CARE_PROVIDER_SITE_OTHER): Payer: 59 | Admitting: Gastroenterology

## 2019-08-04 ENCOUNTER — Other Ambulatory Visit: Payer: Self-pay

## 2019-08-04 ENCOUNTER — Encounter: Payer: Self-pay | Admitting: Gastroenterology

## 2019-08-04 VITALS — BP 110/75 | HR 74 | Temp 98.5°F | Ht 63.0 in | Wt 144.8 lb

## 2019-08-04 DIAGNOSIS — K5900 Constipation, unspecified: Secondary | ICD-10-CM | POA: Diagnosis not present

## 2019-08-04 NOTE — Progress Notes (Signed)
[name] MD, MRCP(U.K) [address]  [place]  [place], [place] [address]  Main: [phone]  Fax: [phone]   Primary Care Physician: Paulene Floor  Primary Gastroenterologist:  Dr. [name]   Chief Complaint  Patient presents with  . Follow-up    Constipation    HPI: Michele Bell is a 23 y.o. female    Summary of history :  Initially seen at the ER on 05/12/2019 for abdominal pain right lower quadrant at [facility]  Per the ER note she underwent a CT scan of the abdomen and pelvis which was normal.  Pelvic exam was normal.  Normal pelvic ultrasound.  CBC, CMP, urinalysis, pregnancy tests, lipase were all normal.  She returned to the ER a day later 2 of constipation.  Was discharged home on MiraLAX.  On 05/19/2019 she presented to the [facility] ER with abdominal pain and constipation.  She was given some lactulose and discharged.  She says that she has not had issues with constipation in the past but over the last few weeks has had issues with bowel movements.  She recalls that when she went to the ER she not had a bowel movement for over 4 days and the pain that gradually built up all over her abdomen.  Subsequently she was given a Gatorade MiraLAX bowel prep and after emptying out her bowels she felt significantly better.  Since then the constipation has recurred and she has had on and off abdominal discomfort related to not having a bowel movement.  Her diet is poor in fiber and she says she just cannot eat  fruits and vegetables.  No family history of colon cancer or polyps.  Recently had a rectal bleeding when she passed very hard stool.  She has lost about 30 pounds of weight since March this year.  She says she feels really hungry but dislikes the taste of foods at times.   Interval history 07/09/2019: 08/04/2019  07/14/2019 colonoscopy: Normal  She says that the Linzess 145 mcg caused her to have severe diarrhea and  hence she stopped.  Unfortunately she did not contact her office which we could have given her a lower dose before today's visit.  She says that she has poor appetite on and off and it is probably contributing to her weight loss.  It has been attributed that she is probably having a mood disorder.  She has been commenced on Zoloft.  She denies any other specific GI symptoms.  Current Outpatient Medications  Medication Sig Dispense Refill  . linaclotide (LINZESS) 145 MCG CAPS capsule Take 145 mcg by mouth daily before breakfast.    . sertraline (ZOLOFT) 50 MG tablet Take 1 tablet (50 mg total) by mouth daily. 90 tablet 0  . traZODone (DESYREL) 50 MG tablet Take 0.5-1 tablets (25-50 mg total) by mouth at bedtime as needed for sleep. 30 tablet 0   No current facility-administered medications for this visit.     Allergies as of 08/04/2019 - Review Complete 08/04/2019  Allergen Reaction Noted  . Hydrocodone-acetaminophen Rash 09/22/2014  . Percocet [oxycodone-acetaminophen] Rash 08/01/2017    ROS:  General: Negative for anorexia, weight loss, fever, chills, fatigue, weakness. ENT: Negative for hoarseness, difficulty swallowing , nasal congestion. CV: Negative for chest pain, angina, palpitations, dyspnea on exertion, peripheral edema.  Respiratory: Negative for dyspnea at rest, dyspnea on exertion, cough, sputum, wheezing.  GI: See history of present illness. GU:  Negative for dysuria, hematuria,  urinary incontinence, urinary frequency, nocturnal urination.  Endo: Negative for unusual weight change.    Physical Examination:   BP 110/75   Pulse 74   Temp 98.5 F (36.9 C)   Ht 5\' 3"  (1.6 m)   Wt 144 lb 12.8 oz (65.7 kg)   BMI 25.65 kg/m   General: Well-nourished, well-developed in no acute distress.  Eyes: No icterus. Conjunctivae pink. Mouth: Oropharyngeal mucosa moist and pink , no lesions erythema or exudate. Lungs: Clear to auscultation bilaterally. Non-labored. Heart: Regular  rate and rhythm, no murmurs rubs or gallops.  Abdomen: Bowel sounds are normal, nontender, nondistended, no hepatosplenomegaly or masses, no abdominal bruits or hernia , no rebound or guarding.   Extremities: No lower extremity edema. No clubbing or deformities. Neuro: Alert and oriented x 3.  Grossly intact. Skin: Warm and dry, no jaundice.   Psych: Alert and cooperative, normal mood and affect.   Imaging Studies: No results found.  Assessment and Plan:   Michele Bell is a 23 y.o. y/o femalehere to follow-up for IBS-C .  Normal colonoscopy.  Linzess 145 mcg caused her to have diarrhea.  She stopped taking it.  She has lost some weight and it has been attributed to depression/mood disorder.  She has been commenced on Zoloft and we will monitor along to see how it helps her.  Plan 1.  Commence on Linzess 72 mcg samples will be provided. 2.  Follow-up in my office in 2 weeks to determine if dose needs to be titrated.   Dr 30  MD,MRCP [facility]) Follow up in 2 weeks

## 2019-08-20 ENCOUNTER — Ambulatory Visit: Payer: Self-pay | Admitting: *Deleted

## 2019-08-20 ENCOUNTER — Ambulatory Visit (INDEPENDENT_AMBULATORY_CARE_PROVIDER_SITE_OTHER): Payer: 59 | Admitting: Gastroenterology

## 2019-08-20 ENCOUNTER — Other Ambulatory Visit: Payer: Self-pay

## 2019-08-20 VITALS — BP 106/70 | HR 71 | Temp 98.1°F | Ht 63.0 in | Wt 147.6 lb

## 2019-08-20 DIAGNOSIS — K5900 Constipation, unspecified: Secondary | ICD-10-CM

## 2019-08-20 MED ORDER — LACTULOSE 10 GM/15ML PO SOLN: 10.0000 g | Freq: Three times a day (TID) | ORAL | 2 refills | Status: DC

## 2019-08-20 NOTE — Progress Notes (Signed)
[name] MD, MRCP(U.K) [address]  [address]  [place], [place] [address]  Main: [phone]  Fax: [phone]   Primary Care Physician: Trey Sailors, PA-C  Primary Gastroenterologist:  Dr. [name]   Follow-up for constipation  HPI: Michele Bell is a 23 y.o. female    Summary of history :  Initially seen at the ER on 05/12/2019 for abdominal pain right lower quadrant at [facility]. Per the ER note she underwent a CT scan of the abdomen and pelvis which was normal. Pelvic exam was normal. Normal pelvic ultrasound. CBC, CMP, urinalysis, pregnancy tests, lipase were all normal. She returned to the ER a day later 2 of constipation. Was discharged home on MiraLAX. On 05/19/2019 she presented to the [facility] ER with abdominal pain and constipation. She was given some lactulose and discharged.  She recalls that when she went to the ER she not had a bowel movement for over 4 days and the pain that gradually built up all over her abdomen. Subsequently she was given a Gatorade MiraLAX bowel prep and after emptying out her bowels she felt significantly better. Since then the constipation has recurred and she has had on and off abdominal discomfort related to not having a bowel movement. Her diet is poor in fiber and she says she just cannot eat fruits and vegetables. No family history of colon cancer or polyps. Recently had a rectal bleeding when she passed very hard stool. She had lost about 30 pounds of weight since March this year. She says she feels really hungry but dislikes the taste of foods at times.  07/14/2019 colonoscopy: Normal  She has been commenced on Zoloft for mood disorder  Interval history 08/04/2019-08/20/2019  Gained 3 pounds since last visit.  Having issues sleeping at night.  In terms of her GI symptoms having more often bowel movements.  On 72 mcg of Linzess.  At times being constipated.  Finding it hard to  get the right dose between 72 mcg and 145 mcg.    Current Outpatient Medications  Medication Sig Dispense Refill  . linaclotide (LINZESS) 145 MCG CAPS capsule Take 145 mcg by mouth daily before breakfast.    . sertraline (ZOLOFT) 50 MG tablet Take 1 tablet (50 mg total) by mouth daily. 90 tablet 0  . traZODone (DESYREL) 50 MG tablet Take 0.5-1 tablets (25-50 mg total) by mouth at bedtime as needed for sleep. 30 tablet 0   No current facility-administered medications for this visit.     Allergies as of 08/20/2019 - Review Complete 08/04/2019  Allergen Reaction Noted  . Hydrocodone-acetaminophen Rash 09/22/2014  . Percocet [oxycodone-acetaminophen] Rash 08/01/2017    ROS:  General: Negative for anorexia, weight loss, fever, chills, fatigue, weakness. ENT: Negative for hoarseness, difficulty swallowing , nasal congestion. CV: Negative for chest pain, angina, palpitations, dyspnea on exertion, peripheral edema.  Respiratory: Negative for dyspnea at rest, dyspnea on exertion, cough, sputum, wheezing.  GI: See history of present illness. GU:  Negative for dysuria, hematuria, urinary incontinence, urinary frequency, nocturnal urination.  Endo: Negative for unusual weight change.    Physical Examination:   There were no vitals taken for this visit.  General: Well-nourished, well-developed in no acute distress.  Eyes: No icterus. Conjunctivae pink. Neuro: Alert and oriented x 3.  Grossly intact. Skin: Warm and dry, no jaundice.   Psych: Alert and cooperative, normal mood and affect.   Imaging Studies: No results found.  Assessment and Plan:  Michele Bell is a 22 y.o. y/o female here to follow-up for constipation likely  IBS-C.  Normal colonoscopy.  Linzess 145 mcg caused her to have diarrhea.  She stopped taking it.    Gained weight since her last visit.  Doing reasonably well on 72 mcg.  Has or days where she feels she could benefit from a bowel movement.  I will  add lactulose as needed.  Provide samples.  Discussed again about high-fiber diet.  Will provide information on a high-fiber diet and provide fiber pills supplements.  Follow-up in 4 weeks telephone or video visit to titrate her medications further.  She is on Zoloft and has been 4 weeks since she started it    Dr [name]  MD,MRCP [facility]) Follow up in 4 to 6 weeks telephone or video visit

## 2019-08-20 NOTE — Patient Instructions (Signed)

## 2019-08-20 NOTE — Chronic Care Management (AMB) (Signed)
    Care Management   Unsuccessful Call Note 08/20/2019 Name: Michele Bell MRN: [id number] DOB: 01-Dec-1995  Patient is a 23 year old femalewho sees Carles Collet, [place] for primary care. Carles Collet, PA-Casked the CCM team to consult the patient for mental health counseling and resources.  Referral was placed 07/24/19. Patient's last office visit was 07/24/19.   This social worker was unable to reach patient via telephone today forinitial assessment. Patient did not have voicemail set up to leave a message (unsuccessful outreach #2).     Plan: Will follow-up within 7 business days via telephone.      Elliot Gurney, [place] Worker  [facility] [phone]

## 2019-08-24 ENCOUNTER — Encounter: Payer: Self-pay | Admitting: Physician Assistant

## 2019-08-24 ENCOUNTER — Other Ambulatory Visit: Payer: Self-pay

## 2019-08-24 MED ORDER — LINACLOTIDE 72 MCG PO CAPS: 72.0000 ug | ORAL_CAPSULE | Freq: Every day | ORAL | 3 refills | Status: DC

## 2019-09-04 ENCOUNTER — Other Ambulatory Visit: Payer: Self-pay

## 2019-09-04 ENCOUNTER — Ambulatory Visit (INDEPENDENT_AMBULATORY_CARE_PROVIDER_SITE_OTHER): Payer: Managed Care, Other (non HMO) | Admitting: Physician Assistant

## 2019-09-04 ENCOUNTER — Encounter: Payer: Self-pay | Admitting: Physician Assistant

## 2019-09-04 ENCOUNTER — Ambulatory Visit: Payer: Self-pay | Admitting: *Deleted

## 2019-09-04 VITALS — BP 109/75 | HR 69 | Temp 97.1°F | Resp 18 | Ht 63.0 in | Wt 148.0 lb

## 2019-09-04 DIAGNOSIS — R69 Illness, unspecified: Secondary | ICD-10-CM | POA: Diagnosis not present

## 2019-09-04 DIAGNOSIS — F32A Depression, unspecified: Secondary | ICD-10-CM | POA: Insufficient documentation

## 2019-09-04 DIAGNOSIS — Z124 Encounter for screening for malignant neoplasm of cervix: Secondary | ICD-10-CM

## 2019-09-04 DIAGNOSIS — F329 Major depressive disorder, single episode, unspecified: Secondary | ICD-10-CM

## 2019-09-04 DIAGNOSIS — F419 Anxiety disorder, unspecified: Secondary | ICD-10-CM | POA: Diagnosis not present

## 2019-09-04 MED ORDER — BUSPIRONE HCL 7.5 MG PO TABS: 7.5000 mg | ORAL_TABLET | Freq: Two times a day (BID) | ORAL | 0 refills | Status: AC

## 2019-09-04 MED ORDER — HYDROXYZINE HCL 10 MG PO TABS: 10.0000 mg | ORAL_TABLET | Freq: Three times a day (TID) | ORAL | 0 refills | Status: AC | PRN

## 2019-09-04 NOTE — Progress Notes (Signed)
Patient: Michele Bell Female    DOB: [birth year]   23 y.o.   MRN: [id number] Visit Date: 09/04/2019  Today's Provider: Trey Sailors, PA-C   Chief Complaint  Patient presents with  . Follow-up  . Anxiety   Subjective:    Anxiety  Counseled this is OK to be on during pregnancy. Will start as below and f/u in 4-6 weeks.  - sertraline (ZOLOFT) 50 MG tablet; Take 1 tablet (50 mg total) by mouth daily.  Dispense: 90 tablet; Refill: 0 - Ambulatory referral to [facility]  Depression, unspecified depression type  - sertraline (ZOLOFT) 50 MG tablet; Take 1 tablet (50 mg total) by mouth daily.  Dispense: 90 tablet; Refill: 0 - Ambulatory referral to [facility] Care Management Services  Insomnia, unspecified type  - traZODone (DESYREL) 50 MG tablet; Take 0.5-1 tablets (25-50 mg total) by mouth at bedtime as needed for sleep.  Dispense: 30 tablet; Refill: 0   Anxiety Presents for follow-up visit. Symptoms include excessive worry, insomnia, irritability, nervous/anxious behavior and restlessness. Patient reports no chest pain, hyperventilation, nausea or suicidal ideas. Symptoms occur most days. The severity of symptoms is moderate.     In the interim, she reports her depression has improved on zoloft but feels her anxiety has worsened to the point that trazadone is not helping her sleep anymore. Setting up counseling with [facility] in the mean time.   Allergies  Allergen Reactions  . Hydrocodone-Acetaminophen Rash  . Percocet [Oxycodone-Acetaminophen] Rash     Current Outpatient Medications:  .  lactulose (CHRONULAC) 10 GM/15ML solution, Take 15 mLs (10 g total) by mouth 3 (three) times daily. PRN, Disp: 1350 mL, Rfl: 2 .  linaclotide (LINZESS) 72 MCG capsule, Take 1 capsule (72 mcg total) by mouth daily before breakfast., Disp: 90 capsule, Rfl: 3 .  sertraline (ZOLOFT) 50 MG tablet, Take 1 tablet (50 mg total) by mouth daily., Disp: 90  tablet, Rfl: 0 .  traZODone (DESYREL) 50 MG tablet, Take 0.5-1 tablets (25-50 mg total) by mouth at bedtime as needed for sleep., Disp: 30 tablet, Rfl: 0  Review of Systems  Constitutional: Positive for irritability. Negative for activity change, fatigue and fever.  HENT: Negative.   Eyes: Negative.   Respiratory: Negative.   Cardiovascular: Negative for chest pain.  Gastrointestinal: Negative for nausea.  Endocrine: Negative.   Genitourinary: Negative.   Musculoskeletal: Negative.   Allergic/Immunologic: Negative.   Neurological: Negative.   Hematological: Negative.   Psychiatric/Behavioral: Negative for suicidal ideas. The patient is nervous/anxious and has insomnia.     Social History   Tobacco Use  . Smoking status: Current Every Day Smoker    Types: E-cigarettes  . Smokeless tobacco: Never Used  Substance Use Topics  . Alcohol use: Yes      Objective:   BP 109/75   Pulse 69   Temp (!) 97.1 F (36.2 C) (Temporal)   Resp 18   Ht 5\' 3"  (1.6 m)   Wt 148 lb (67.1 kg)   LMP 08/05/2019 (Approximate)   SpO2 97%   BMI 26.22 kg/m  Vitals:   09/04/19 1341  BP: 109/75  Pulse: 69  Resp: 18  Temp: (!) 97.1 F (36.2 C)  TempSrc: Temporal  SpO2: 97%  Weight: 148 lb (67.1 kg)  Height: 5\' 3"  (1.6 m)  Body mass index is 26.22 kg/m.   Physical Exam Constitutional:      Appearance: Normal appearance.  Cardiovascular:  Rate and Rhythm: Normal rate and regular rhythm.  Pulmonary:     Effort: Pulmonary effort is normal.     Breath sounds: Normal breath sounds.  Skin:    General: Skin is warm and dry.  Neurological:     Mental Status: She is alert and oriented to person, place, and time. Mental status is at baseline.  Psychiatric:        Mood and Affect: Mood normal.        Behavior: Behavior normal.      No results found for any visits on 09/04/19.     Assessment & Plan    1. Anxiety and depression  Discussed various options of switching medications  vs adding medications. She would like to add buspar onto zoloft and then we will have hydroxyzine PRN. She is in the process of setting up counseling with [facility].   - busPIRone (BUSPAR) 7.5 MG tablet; Take 1 tablet (7.5 mg total) by mouth 2 (two) times daily.  Dispense: 180 tablet; Refill: 0 - hydrOXYzine (ATARAX/VISTARIL) 10 MG tablet; Take 1 tablet (10 mg total) by mouth 3 (three) times daily as needed.  Dispense: 90 tablet; Refill: 0  2. Cervical cancer screening  We will plan for PAP smear at the next visit.   The entirety of the information documented in the History of Present Illness, Review of Systems and Physical Exam were personally obtained by me. Portions of this information were initially documented by [facility], CMA and reviewed by me for thoroughness and accuracy.           [name], PA-C  [facility]

## 2019-09-04 NOTE — Chronic Care Management (AMB) (Signed)
    Care Management   Unsuccessful Call Note 09/04/2019 Name: Michele Bell MRN: [id number] DOB: 1995-11-11  Patient is a 23 year old female  who sees Carles Collet, [place] for primary care. Carles Collet, PA-C asked the CCM team to consult the patient for Counseling and [facility].     This social worker was unable to reach patient via telephone today. I have left HIPAA compliant voicemail asking patient to return my call. (unsuccessful outreach #[phone]).   Plan: This Education officer, museum will make no additional attempts to reach patient, however will be happy to engage patient upon her return call.     Elliot Gurney, [place] Worker  [facility] Management [phone]

## 2019-09-04 NOTE — Patient Instructions (Signed)

## 2019-10-01 ENCOUNTER — Ambulatory Visit (INDEPENDENT_AMBULATORY_CARE_PROVIDER_SITE_OTHER): Payer: Managed Care, Other (non HMO) | Admitting: Gastroenterology

## 2019-10-01 ENCOUNTER — Ambulatory Visit: Payer: 59 | Admitting: Gastroenterology

## 2019-10-01 DIAGNOSIS — K5900 Constipation, unspecified: Secondary | ICD-10-CM | POA: Diagnosis not present

## 2019-10-01 NOTE — Progress Notes (Signed)
Michele Bell , MD [address]  [address]  [place], [place] [address]  Main: [phone]  Fax: [phone]   Primary Care Physician: Trey Sailors, PA-C  Virtual Visit via Video Note  I connected with patient on 10/01/19 at  3:00 PM EST by video and verified that I am speaking with the correct person using two identifiers.   I discussed the limitations, risks, security and privacy concerns of performing an evaluation and management service by video  and the availability of in person appointments. I also discussed with the patient that there may be a patient responsible charge related to this service. The patient expressed understanding and agreed to proceed.  Location of Patient: Home Location of Provider: Home Persons involved: Patient and provider only   History of Present Illness: No chief complaint on file.   HPI: Michele Bell is a 24 y.o. female   Summary of history :  Initially seen at the ER on 05/12/2019 for abdominal pain right lower quadrant at [facility]. Per the ER note she underwent a CT scan of the abdomen and pelvis which was normal. Pelvic exam was normal. Normal pelvic ultrasound. CBC, CMP, urinalysis, pregnancy tests, lipase were all normal. She returned to the ER a day later with  constipation. Was discharged home on MiraLAX. On 05/19/2019 she presented to the [facility] ER with abdominal pain and constipation. She was given some lactulose and discharged.  She has had a history of recurrent constipation for 4-5 years. No recent change Her diet is poor in fiber and she says she just cannot eat fruits and vegetables. No family history of colon cancer or polyps. Recently had a rectal bleeding when she passed very hard stool. She had lost about 30 pounds of weight since March this year.  At her last visit in December 2020 she had gained 3 pounds. She says she feels really hungry but dislikes the taste of foods at  times.  07/14/2019 colonoscopy: Normal  She has been commenced on Zoloft for Bell disorder  Interval history12/11/2018-09/30/2018  She has been taking Linzess 72 mcg daily.  In addition she takes lactulose as needed.  With this regimen she has been doing well.  She says her anxiety is also better after starting the Zoloft.  No other complaints today.     Current Outpatient Medications  Medication Sig Dispense Refill  . busPIRone (BUSPAR) 7.5 MG tablet Take 1 tablet (7.5 mg total) by mouth 2 (two) times daily. 180 tablet 0  . hydrOXYzine (ATARAX/VISTARIL) 10 MG tablet Take 1 tablet (10 mg total) by mouth 3 (three) times daily as needed. 90 tablet 0  . lactulose (CHRONULAC) 10 GM/15ML solution Take 15 mLs (10 g total) by mouth 3 (three) times daily. PRN 1350 mL 2  . linaclotide (LINZESS) 72 MCG capsule Take 1 capsule (72 mcg total) by mouth daily before breakfast. 90 capsule 3  . sertraline (ZOLOFT) 50 MG tablet Take 1 tablet (50 mg total) by mouth daily. 90 tablet 0  . traZODone (DESYREL) 50 MG tablet Take 0.5-1 tablets (25-50 mg total) by mouth at bedtime as needed for sleep. 30 tablet 0   No current facility-administered medications for this visit.    Allergies as of 10/01/2019 - Review Complete 09/04/2019  Allergen Reaction Noted  . Hydrocodone-acetaminophen Rash 09/22/2014  . Percocet [oxycodone-acetaminophen] Rash 08/01/2017    Review of Systems:    All systems reviewed and negative except where noted in HPI.  General Appearance:    Alert, cooperative, no distress, appears stated age  Head:    Normocephalic, without obvious abnormality, atraumatic  Eyes:    PERRL, conjunctiva/corneas clear,  Ears:    Grossly normal hearing    Neurologic:  Grossly normal    Observations/Objective:  Labs: CMP     Component Value Date/Time   NA 138 05/18/2019 2055   K 4.0 05/18/2019 2055   CL 105 05/18/2019 2055   CO2 23 05/18/2019 2055   GLUCOSE 92 05/18/2019 2055   BUN 7  05/18/2019 2055   CREATININE 0.61 05/18/2019 2055   CALCIUM 9.4 05/18/2019 2055   PROT 8.3 (H) 05/18/2019 2055   ALBUMIN 4.7 05/18/2019 2055   AST 16 05/18/2019 2055   ALT 12 05/18/2019 2055   ALKPHOS 55 05/18/2019 2055   BILITOT 0.4 05/18/2019 2055   GFRNONAA >60 05/18/2019 2055   GFRAA >60 05/18/2019 2055   Lab Results  Component Value Date   WBC 12.3 (H) 05/18/2019   HGB 13.9 05/18/2019   HCT 42.3 05/18/2019   MCV 84.3 05/18/2019   PLT 336 05/18/2019    Imaging Studies: No results found.  Assessment and Plan:   Michele Bell is a 24 y.o. y/o female here to follow-up for constipation likely  IBS-C.Normal colonoscopy. Linzess 145 mcg caused her to have diarrhea. She stopped taking it.   Gained weight since her last visit.  Doing reasonably well on 72 mcg.  At last visit doing much better with lactulose added on as needed.  No new changes.  No new concerns.  She is aware that if things change to call me back right away.    I discussed the assessment and treatment plan with the patient. The patient was provided an opportunity to ask questions and all were answered. The patient agreed with the plan and demonstrated an understanding of the instructions.   The patient was advised to call back or seek an in-person evaluation if the symptoms worsen or if the condition fails to improve as anticipated.   Dr [name] MD,MRCP [facility]) Gastroenterology/Hepatology Pager: [phone]   Speech recognition software was used to dictate this note.

## 2019-10-06 ENCOUNTER — Encounter: Payer: Self-pay | Admitting: Physician Assistant

## 2019-10-06 DIAGNOSIS — Z30011 Encounter for initial prescription of contraceptive pills: Secondary | ICD-10-CM

## 2019-10-06 MED ORDER — ETHYNODIOL DIAC-ETH ESTRADIOL 1-35 MG-MCG PO TABS: 1.0000 | ORAL_TABLET | Freq: Every day | ORAL | 3 refills | Status: DC

## 2019-10-09 DIAGNOSIS — Z20828 Contact with and (suspected) exposure to other viral communicable diseases: Secondary | ICD-10-CM | POA: Diagnosis not present

## 2019-10-16 ENCOUNTER — Ambulatory Visit: Payer: Managed Care, Other (non HMO) | Admitting: Physician Assistant

## 2019-10-29 DIAGNOSIS — Z20828 Contact with and (suspected) exposure to other viral communicable diseases: Secondary | ICD-10-CM | POA: Diagnosis not present

## 2019-11-17 ENCOUNTER — Other Ambulatory Visit: Payer: Self-pay | Admitting: Physician Assistant

## 2019-11-17 DIAGNOSIS — G4700 Insomnia, unspecified: Secondary | ICD-10-CM

## 2019-11-17 DIAGNOSIS — F419 Anxiety disorder, unspecified: Secondary | ICD-10-CM

## 2019-11-17 DIAGNOSIS — F329 Major depressive disorder, single episode, unspecified: Secondary | ICD-10-CM

## 2019-11-17 DIAGNOSIS — F32A Depression, unspecified: Secondary | ICD-10-CM

## 2019-11-17 MED ORDER — SERTRALINE HCL 50 MG PO TABS: 50.0000 mg | ORAL_TABLET | Freq: Every day | ORAL | 1 refills | Status: DC

## 2019-11-17 MED ORDER — TRAZODONE HCL 50 MG PO TABS: 25.0000 mg | ORAL_TABLET | Freq: Every evening | ORAL | 1 refills | Status: DC | PRN

## 2019-11-21 ENCOUNTER — Encounter: Payer: Self-pay | Admitting: Physician Assistant

## 2019-11-22 ENCOUNTER — Encounter: Payer: Self-pay | Admitting: Physician Assistant

## 2019-11-23 ENCOUNTER — Telehealth (INDEPENDENT_AMBULATORY_CARE_PROVIDER_SITE_OTHER): Payer: 59 | Admitting: Physician Assistant

## 2019-11-23 ENCOUNTER — Other Ambulatory Visit: Payer: Self-pay

## 2019-11-23 DIAGNOSIS — R3989 Other symptoms and signs involving the genitourinary system: Secondary | ICD-10-CM | POA: Diagnosis not present

## 2019-11-23 DIAGNOSIS — J039 Acute tonsillitis, unspecified: Secondary | ICD-10-CM

## 2019-11-23 DIAGNOSIS — N3090 Cystitis, unspecified without hematuria: Secondary | ICD-10-CM

## 2019-11-23 LAB — POCT URINALYSIS DIPSTICK
Bilirubin, UA: NEGATIVE
Blood, UA: NEGATIVE
Glucose, UA: NEGATIVE
Ketones, UA: NEGATIVE
Leukocytes, UA: NEGATIVE
Nitrite, UA: NEGATIVE
Protein, UA: POSITIVE — AB
Spec Grav, UA: 1.025 (ref 1.010–1.025)
Urobilinogen, UA: 0.2 E.U./dL
pH, UA: 5 (ref 5.0–8.0)

## 2019-11-23 MED ORDER — PHENAZOPYRIDINE HCL 200 MG PO TABS: 200.0000 mg | ORAL_TABLET | Freq: Three times a day (TID) | ORAL | 0 refills | Status: DC | PRN

## 2019-11-23 MED ORDER — SULFAMETHOXAZOLE-TRIMETHOPRIM 800-160 MG PO TABS: 1.0000 | ORAL_TABLET | Freq: Two times a day (BID) | ORAL | 0 refills | Status: AC

## 2019-11-23 NOTE — Progress Notes (Signed)
Patient: Michele Bell Female    DOB: 1996-03-09   23 y.o.   MRN: [id number] Visit Date: 11/24/2019  Today's Provider: [name], PA-C   Chief Complaint  Patient presents with  . Dysuria   Subjective:    Virtual Visit via Video Note  I connected with Michele Bell on 11/24/19 at  4:00 PM EST by a video enabled telemedicine application and verified that I am speaking with the correct person using two identifiers.  Location: Patient: Home Provider: Office   I discussed the limitations of evaluation and management by telemedicine and the availability of in person appointments. The patient expressed understanding and agreed to proceed.  HPI   Patient reports dysuria x several days and back pain. She denies fever. She reports nausea without vomiting. Denies pelvic pain and vaginal discharge.   She also reports tonsil stones and is interested in tonsillectomy.   Allergies  Allergen Reactions  . Hydrocodone-Acetaminophen Rash  . Percocet [Oxycodone-Acetaminophen] Rash     Current Outpatient Medications:  .  busPIRone (BUSPAR) 7.5 MG tablet, Take 1 tablet (7.5 mg total) by mouth 2 (two) times daily., Disp: 180 tablet, Rfl: 0 .  ethynodiol-ethinyl estradiol (KELNOR 1/35) 1-35 MG-MCG tablet, Take 1 tablet by mouth daily., Disp: 3 Package, Rfl: 3 .  hydrOXYzine (ATARAX/VISTARIL) 10 MG tablet, Take 1 tablet (10 mg total) by mouth 3 (three) times daily as needed., Disp: 90 tablet, Rfl: 0 .  lactulose (CHRONULAC) 10 GM/15ML solution, Take 15 mLs (10 g total) by mouth 3 (three) times daily. PRN, Disp: 1350 mL, Rfl: 2 .  linaclotide (LINZESS) 72 MCG capsule, Take 1 capsule (72 mcg total) by mouth daily before breakfast., Disp: 90 capsule, Rfl: 3 .  sertraline (ZOLOFT) 50 MG tablet, Take 1 tablet (50 mg total) by mouth daily., Disp: 90 tablet, Rfl: 1 .  traZODone (DESYREL) 50 MG tablet, Take 0.5-1 tablets (25-50 mg total) by mouth at bedtime as needed for  sleep., Disp: 30 tablet, Rfl: 1 .  phenazopyridine (PYRIDIUM) 200 MG tablet, Take 1 tablet (200 mg total) by mouth 3 (three) times daily as needed for pain., Disp: 10 tablet, Rfl: 0 .  sulfamethoxazole-trimethoprim (BACTRIM DS) 800-160 MG tablet, Take 1 tablet by mouth 2 (two) times daily for 7 days., Disp: 14 tablet, Rfl: 0  Review of Systems  12 point ROS reviewed and is negative except for HPI.  Social History   Tobacco Use  . Smoking status: Current Every Day Smoker    Types: E-cigarettes  . Smokeless tobacco: Never Used  Substance Use Topics  . Alcohol use: Yes      Objective:   There were no vitals taken for this visit. There were no vitals filed for this visit.There is no height or weight on file to calculate BMI.   Physical Exam   Results for orders placed or performed in visit on 11/23/19  POCT urinalysis dipstick  Result Value Ref Range   Color, UA Yellow    Clarity, UA Clear    Glucose, UA Negative Negative   Bilirubin, UA Negative    Ketones, UA Negative    Spec Grav, UA 1.025 1.010 - 1.025   Blood, UA Negative    pH, UA 5.0 5.0 - 8.0   Protein, UA Positive (A) Negative   Urobilinogen, UA 0.2 0.2 or 1.0 E.U./dL   Nitrite, UA Negative    Leukocytes, UA Negative Negative   Appearance     Odor  Assessment & Plan    1. Cystitis  Reminded to schedule PAP.   - sulfamethoxazole-trimethoprim (BACTRIM DS) 800-160 MG tablet; Take 1 tablet by mouth 2 (two) times daily for 7 days.  Dispense: 14 tablet; Refill: 0 - phenazopyridine (PYRIDIUM) 200 MG tablet; Take 1 tablet (200 mg total) by mouth 3 (three) times daily as needed for pain.  Dispense: 10 tablet; Refill: 0  2. Possible urinary tract infection  - POCT urinalysis dipstick - Urine Culture  3. Tonsillitis  - Ambulatory referral to ENT I discussed the assessment and treatment plan with the patient. The patient was provided an opportunity to ask questions and all were answered. The patient  agreed with the plan and demonstrated an understanding of the instructions.   The patient was advised to call back or seek an in-person evaluation if the symptoms worsen or if the condition fails to improve as anticipated.  I provided 15 minutes of non-face-to-face time during this encounter.  The entirety of the information documented in the History of Present Illness, Review of Systems and Physical Exam were personally obtained by me. Portions of this information were initially documented by [facility] and reviewed by me for thoroughness and accuracy.       Trey Sailors, PA-C  [facility]

## 2019-11-25 ENCOUNTER — Ambulatory Visit: Payer: Managed Care, Other (non HMO) | Admitting: Physician Assistant

## 2019-11-25 LAB — URINE CULTURE

## 2019-12-17 DIAGNOSIS — H903 Sensorineural hearing loss, bilateral: Secondary | ICD-10-CM | POA: Diagnosis not present

## 2019-12-17 DIAGNOSIS — J3501 Chronic tonsillitis: Secondary | ICD-10-CM | POA: Diagnosis not present

## 2019-12-31 ENCOUNTER — Other Ambulatory Visit: Payer: Self-pay

## 2019-12-31 ENCOUNTER — Encounter: Payer: Self-pay | Admitting: Otolaryngology

## 2020-01-04 ENCOUNTER — Other Ambulatory Visit
Admission: RE | Admit: 2020-01-04 | Discharge: 2020-01-04 | Disposition: A | Discharge status: Home / Self Care | Payer: 59 | Source: Ambulatory Visit | Attending: Otolaryngology | Admitting: Otolaryngology

## 2020-01-04 ENCOUNTER — Other Ambulatory Visit: Payer: Self-pay

## 2020-01-04 DIAGNOSIS — Z20822 Contact with and (suspected) exposure to covid-19: Secondary | ICD-10-CM | POA: Diagnosis not present

## 2020-01-04 DIAGNOSIS — Z01812 Encounter for preprocedural laboratory examination: Secondary | ICD-10-CM | POA: Diagnosis not present

## 2020-01-05 LAB — SARS CORONAVIRUS 2 (TAT 6-24 HRS): SARS Coronavirus 2: NEGATIVE

## 2020-01-06 ENCOUNTER — Other Ambulatory Visit: Payer: Self-pay

## 2020-01-06 ENCOUNTER — Encounter
Admission: RE | Disposition: A | Discharge status: Home / Self Care | Payer: Self-pay | Source: Home / Self Care | Attending: Otolaryngology

## 2020-01-06 ENCOUNTER — Ambulatory Visit: Payer: 59 | Admitting: Anesthesiology

## 2020-01-06 ENCOUNTER — Encounter: Payer: Self-pay | Admitting: Otolaryngology

## 2020-01-06 ENCOUNTER — Ambulatory Visit
Admission: RE | Admit: 2020-01-06 | Discharge: 2020-01-06 | Disposition: A | Discharge status: Home / Self Care | Payer: 59 | Attending: Otolaryngology | Admitting: Otolaryngology

## 2020-01-06 DIAGNOSIS — K589 Irritable bowel syndrome without diarrhea: Secondary | ICD-10-CM | POA: Diagnosis not present

## 2020-01-06 DIAGNOSIS — F419 Anxiety disorder, unspecified: Secondary | ICD-10-CM | POA: Insufficient documentation

## 2020-01-06 DIAGNOSIS — Z79899 Other long term (current) drug therapy: Secondary | ICD-10-CM | POA: Insufficient documentation

## 2020-01-06 DIAGNOSIS — J3501 Chronic tonsillitis: Secondary | ICD-10-CM | POA: Diagnosis not present

## 2020-01-06 DIAGNOSIS — Z793 Long term (current) use of hormonal contraceptives: Secondary | ICD-10-CM | POA: Diagnosis not present

## 2020-01-06 DIAGNOSIS — R69 Illness, unspecified: Secondary | ICD-10-CM | POA: Diagnosis not present

## 2020-01-06 DIAGNOSIS — J351 Hypertrophy of tonsils: Secondary | ICD-10-CM | POA: Diagnosis not present

## 2020-01-06 DIAGNOSIS — F329 Major depressive disorder, single episode, unspecified: Secondary | ICD-10-CM | POA: Insufficient documentation

## 2020-01-06 DIAGNOSIS — F17290 Nicotine dependence, other tobacco product, uncomplicated: Secondary | ICD-10-CM | POA: Diagnosis not present

## 2020-01-06 DIAGNOSIS — J353 Hypertrophy of tonsils with hypertrophy of adenoids: Secondary | ICD-10-CM | POA: Diagnosis not present

## 2020-01-06 HISTORY — DX: Family history of other specified conditions: Z84.89

## 2020-01-06 HISTORY — PX: TONSILLECTOMY AND ADENOIDECTOMY: SHX28

## 2020-01-06 HISTORY — DX: Presence of spectacles and contact lenses: Z97.3

## 2020-01-06 HISTORY — DX: Other specified postprocedural states: Z98.890

## 2020-01-06 HISTORY — DX: Motion sickness, initial encounter: T75.3XXA

## 2020-01-06 HISTORY — DX: Migraine, unspecified, not intractable, without status migrainosus: G43.909

## 2020-01-06 HISTORY — DX: Irritable bowel syndrome without diarrhea: K58.9

## 2020-01-06 HISTORY — DX: Other specified postprocedural states: R11.2

## 2020-01-06 SURGERY — TONSILLECTOMY AND ADENOIDECTOMY
Anesthesia: General | Site: Throat | Laterality: Bilateral

## 2020-01-06 MED ORDER — SCOPOLAMINE 1 MG/3DAYS TD PT72
1.0000 | MEDICATED_PATCH | TRANSDERMAL | Status: DC
  Administered 2020-01-06: 1.5 mg via TRANSDERMAL

## 2020-01-06 MED ORDER — GLYCOPYRROLATE 0.2 MG/ML IJ SOLN
INTRAMUSCULAR | Status: DC | PRN
  Administered 2020-01-06: .1 mg via INTRAVENOUS

## 2020-01-06 MED ORDER — OXYCODONE HCL 5 MG PO TABS: 10.0000 mg | ORAL_TABLET | Freq: Once | ORAL | Status: DC

## 2020-01-06 MED ORDER — PROPOFOL 10 MG/ML IV BOLUS
INTRAVENOUS | Status: DC | PRN
  Administered 2020-01-06: 200 mg via INTRAVENOUS

## 2020-01-06 MED ORDER — LIDOCAINE HCL (CARDIAC) PF 100 MG/5ML IV SOSY
PREFILLED_SYRINGE | INTRAVENOUS | Status: DC | PRN
  Administered 2020-01-06: 30 mg via INTRAVENOUS

## 2020-01-06 MED ORDER — KETOROLAC TROMETHAMINE 30 MG/ML IJ SOLN: 15.0000 mg | Freq: Once | INTRAMUSCULAR | Status: DC | PRN

## 2020-01-06 MED ORDER — ACETAMINOPHEN 10 MG/ML IV SOLN
1000.0000 mg | Freq: Once | INTRAVENOUS | Status: AC
  Administered 2020-01-06: 1000 mg via INTRAVENOUS

## 2020-01-06 MED ORDER — FENTANYL CITRATE (PF) 100 MCG/2ML IJ SOLN
INTRAMUSCULAR | Status: DC | PRN
  Administered 2020-01-06: 100 ug via INTRAVENOUS

## 2020-01-06 MED ORDER — FENTANYL CITRATE (PF) 100 MCG/2ML IJ SOLN
25.0000 ug | INTRAMUSCULAR | Status: DC | PRN
  Administered 2020-01-06: 50 ug via INTRAVENOUS

## 2020-01-06 MED ORDER — MIDAZOLAM HCL 5 MG/5ML IJ SOLN
INTRAMUSCULAR | Status: DC | PRN
  Administered 2020-01-06: 2 mg via INTRAVENOUS

## 2020-01-06 MED ORDER — OXYCODONE HCL 5 MG/5ML PO SOLN
10.0000 mg | Freq: Once | ORAL | Status: AC
  Administered 2020-01-06: 5 mg via ORAL

## 2020-01-06 MED ORDER — DEXAMETHASONE SODIUM PHOSPHATE 4 MG/ML IJ SOLN
INTRAMUSCULAR | Status: DC | PRN
  Administered 2020-01-06: 8 mg via INTRAVENOUS

## 2020-01-06 MED ORDER — OXYMETAZOLINE HCL 0.05 % NA SOLN
NASAL | Status: DC | PRN
  Administered 2020-01-06: 1 via TOPICAL

## 2020-01-06 MED ORDER — DEXMEDETOMIDINE HCL IN NACL 200 MCG/50ML IV SOLN
INTRAVENOUS | Status: DC | PRN
  Administered 2020-01-06: 10 ug via INTRAVENOUS

## 2020-01-06 MED ORDER — LACTATED RINGERS IV SOLN
100.0000 mL/h | INTRAVENOUS | Status: DC
  Administered 2020-01-06: 100 mL/h via INTRAVENOUS

## 2020-01-06 MED ORDER — OXYCODONE HCL 5 MG/5ML PO SOLN: 7.5000 mg | Freq: Four times a day (QID) | ORAL | 0 refills | Status: AC | PRN

## 2020-01-06 MED ORDER — ONDANSETRON HCL 4 MG PO TABS: 4.0000 mg | ORAL_TABLET | Freq: Three times a day (TID) | ORAL | 0 refills | Status: DC | PRN

## 2020-01-06 MED ORDER — BUPIVACAINE HCL (PF) 0.25 % IJ SOLN
INTRAMUSCULAR | Status: DC | PRN
  Administered 2020-01-06: 1 mL

## 2020-01-06 MED ORDER — ONDANSETRON HCL 4 MG/2ML IJ SOLN
INTRAMUSCULAR | Status: DC | PRN
  Administered 2020-01-06: 4 mg via INTRAVENOUS

## 2020-01-06 MED ORDER — SUCCINYLCHOLINE CHLORIDE 20 MG/ML IJ SOLN
INTRAMUSCULAR | Status: DC | PRN
  Administered 2020-01-06: 100 mg via INTRAVENOUS

## 2020-01-06 MED ORDER — ONDANSETRON HCL 4 MG/2ML IJ SOLN: 4.0000 mg | Freq: Once | INTRAMUSCULAR | Status: DC | PRN

## 2020-01-06 MED ORDER — LIDOCAINE VISCOUS HCL 2 % MT SOLN: 10.0000 mL | Freq: Four times a day (QID) | OROMUCOSAL | 0 refills | Status: DC | PRN

## 2020-01-06 SURGICAL SUPPLY — 15 items
BLADE BOVIE TIP EXT 4 (BLADE) ×2 IMPLANT
CANISTER SUCT 1200ML W/VALVE (MISCELLANEOUS) ×2 IMPLANT
CATH ROBINSON RED A/P 10FR (CATHETERS) ×2 IMPLANT
COAG SUCT 10F 3.5MM HAND CTRL (MISCELLANEOUS) ×2 IMPLANT
ELECT REM PT RETURN 9FT ADLT (ELECTROSURGICAL) ×2
ELECTRODE REM PT RTRN 9FT ADLT (ELECTROSURGICAL) ×1 IMPLANT
GLOVE BIO SURGEON STRL SZ7.5 (GLOVE) ×2 IMPLANT
KIT TURNOVER KIT A (KITS) ×2 IMPLANT
NS IRRIG 500ML POUR BTL (IV SOLUTION) ×2 IMPLANT
PACK TONSIL AND ADENOID CUSTOM (PACKS) ×2 IMPLANT
PENCIL SMOKE EVACUATOR (MISCELLANEOUS) ×2 IMPLANT
SLEEVE SUCTION 125 (MISCELLANEOUS) ×2 IMPLANT
SOL ANTI-FOG 6CC FOG-OUT (MISCELLANEOUS) ×1 IMPLANT
SOL FOG-OUT ANTI-FOG 6CC (MISCELLANEOUS) ×1
STRAP BODY AND KNEE 60X3 (MISCELLANEOUS) ×2 IMPLANT

## 2020-01-06 NOTE — Transfer of Care (Signed)
Immediate Anesthesia Transfer of Care Note  Patient: Michele Bell  Procedure(s) Performed: TONSILLECTOMY , ADENOIDECTOMY-ANDENOIDS CAUTERIZED, NO SPECIMEN (Bilateral Throat)  Patient Location: PACU  Anesthesia Type: General  Level of Consciousness: awake, alert  and patient cooperative  Airway and Oxygen Therapy: Patient Spontanous Breathing and Patient connected to supplemental oxygen  Post-op Assessment: Post-op Vital signs reviewed, Patient's Cardiovascular Status Stable, Respiratory Function Stable, Patent Airway and No signs of Nausea or vomiting  Post-op Vital Signs: Reviewed and stable  Complications: No apparent anesthesia complications

## 2020-01-06 NOTE — Discharge Instructions (Signed)
T & A INSTRUCTION SHEET - [facility]  CREIGHTON VAUGHT, MD  [address] [place], [place] [address] [place].  [phone]  INFORMATION SHEET FOR A TONSILLECTOMY AND ADENDOIDECTOMY  About Your Tonsils and Adenoids  The tonsils and adenoids are normal body tissues that are part of our immune system.  They normally help to protect us against diseases that may enter our mouth and nose. However, sometimes the tonsils and/or adenoids become too large and obstruct our breathing, especially at night.    If either of these things happen it helps to remove the tonsils and adenoids in order to become healthier. The operation to remove the tonsils and adenoids is called a tonsillectomy and adenoidectomy.  The Location of Your Tonsils and Adenoids  The tonsils are located in the back of the throat on both side and sit in a cradle of muscles. The adenoids are located in the roof of the mouth, behind the nose, and closely associated with the opening of the Eustachian tube to the ear.  Surgery on Tonsils and Adenoids  A tonsillectomy and adenoidectomy is a short operation which takes about thirty minutes.  This includes being put to sleep and being awakened. Tonsillectomies and adenoidectomies are performed at [facility] and may require observation period in the recovery room prior to going home. Children are required to remain in recovery for at least 45 minutes.   Following the Operation for a Tonsillectomy  A cautery machine is used to control bleeding. Bleeding from a tonsillectomy and adenoidectomy is minimal and postoperatively the risk of bleeding is approximately four percent, although this rarely life threatening.  After your tonsillectomy and adenoidectomy post-op care at home: 1. Our patients are able to go home the same day. You may be given prescriptions for pain medications, if indicated. 2. It is extremely important to  remember that fluid intake is of utmost importance after a tonsillectomy. The amount that you drink must be maintained in the postoperative period. A good indication of whether a child is getting enough fluid is whether his/her urine output is constant. As long as children are urinating or wetting their diaper every 6 - 8 hours this is usually enough fluid intake.   3. Although rare, this is a risk of some bleeding in the first ten days after surgery. This usually occurs between day five and nine postoperatively. This risk of bleeding is approximately four percent. If you or your child should have any bleeding you should remain calm and notify our office or go directly to the emergency room at [facility] where they will contact us. Our doctors are available seven days a week for notification. We recommend sitting up quietly in a chair, place an ice pack on the front of the neck and spitting out the blood gently until we are able to contact you. Adults should gargle gently with ice water and this may help stop the bleeding. If the bleeding does not stop after a short time, i.e. 10 to 15 minutes, or seems to be increasing again, please contact us or go to the hospital.   4. It is common for the pain to be worse at 5 - 7 days postoperatively. This occurs because the "scab" is peeling off and the mucous membrane (skin of the throat) is growing back where the tonsils were.   5. It is common for a low-grade fever, less than 102, during the first week   after a tonsillectomy and adenoidectomy. It is usually due to not drinking enough liquids, and we suggest your use liquid Tylenol (acetaminophen) or the pain medicine with Tylenol (acetaminophen) prescribed in order to keep your temperature below 102. Please follow the directions on the back of the bottle. 6. Recommendations for post-operative pain in children and adults: a) For Children 12 and younger: Recommendations are for oral Tylenol  (acetaminophen) and oral Motrin (Ibuprofen) along with a prescription dose of Prednisolone which is a steroid to help with pain and swelling. Administer the Tylenol (acetaminophen) and Motrin as stated on bottle for patient's age/weight. Sometimes it may be necessary to alternate the Tylenol (acetaminophen) and Motrin for improved pain control. Motrin does last slightly longer so many patients benefit from being given this prior to bedtime. All children should avoid Aspirin products for 2 weeks following surgery. b) For children over the age of 12: Tylenol (acetaminophen) is the preferred first choice for pain control. Depending on your child's size, sometimes they will be given a combination of Tylenol (acetaminophen) and hydrocodone medication or sometimes it will be recommended they take Motrin (ibuprofen) in addition to the Tylenol (acetaminophen). Narcotics should always be used with caution in children following surgery as they can suppress their breathing and switching to over the counter Tylenol (acetaminophen) and Motrin (ibuprofen) as soon as possible is recommended. All patients should avoid Aspirin products for 2 weeks following surgery. c) Adults: Usually adults will require a narcotic pain medication following a tonsillectomy. This usually has either hydrocodone or oxycodone in it and can usually be taken every 4 to 6 hours as needed for moderate pain. If the medication does not have Tylenol (acetaminophen) in it, you may also supplement Tylenol (acetaminophen) as needed every 4 to 6 hours for breakthrough or mild pain. Adults are also given Viscous Lidocaine to swish and spit every 6 hours to help with topical pain. Adults should avoid Aspirin, Aleve, Motrin, and Ibuprofen products for 2 weeks following surgery as they can increase your risk of bleeding. 7. If you happen to look in the mirror or into your child's mouth you will see white/gray patches on the back of the throat. This is what a scab  looks like in the mouth and is normal after having a tonsillectomy and adenoidectomy. They will disappear once the tonsil areas heal completely. However, it may cause a noticeable odor, and this too will disappear with time.     8. You or your child may experience ear pain after having a tonsillectomy and adenoidectomy.  This is called referred pain and comes from the throat, but it is felt in the ears.  Ear pain is quite common and expected. It will usually go away after ten days. There is usually nothing wrong with the ears, and it is primarily due to the healing area stimulating the nerve to the ear that runs along the side of the throat. Use either the prescribed pain medicine or Tylenol (acetaminophen) as needed.  9. The throat tissues after a tonsillectomy are obviously sensitive. Smoking around children who have had a tonsillectomy significantly increases the risk of bleeding. DO NOT SMOKE!  What to Expect Each Day  First Day at Home 1. Patients will be discharged home the same day.  2. Drink at least four glasses of liquid a day. Clear, cool liquids are recommended. Fruit juices containing citric acid are not recommended because they tend to cause pain. Carbonated beverages are allowed if you pour them from glass   to glass to remove the bubbles as these tend to cause discomfort. Avoid alcoholic beverages.  3. Eat very soft foods such as soups, broth, jello, custard, pudding, ice cream, popsicles, applesauce, mashed potatoes, and in general anything that you can crush between your tongue and the roof of your mouth. Try adding Carnation Instant Breakfast Mix into your food for extra calories. It is not uncommon to lose 5 to 10 pounds of fluid weight. The weight will be gained back quickly once you're feeling better and drinking more.  4. Sleep with your head elevated on two pillows for about three days to help decrease the swelling.  5. DO NOT SMOKE!  Day Two  1. Rest as much as possible. Use common  sense in your activities.  2. Continue drinking at least four glasses of liquid per day.  3. Follow the soft diet.  4. Use your pain medication as needed.  Day Three  1. Advance your activity as you are able and continue to follow the previous day's suggestions.  Days Four Through Six  1. Advance your diet and begin to eat more solid foods such as chopped hamburger. 2. Advance your activities slowly. Children should be kept mostly around the house.  3. Not uncommonly, there will be more pain at this time. It is temporary, usually lasting a day or two.  Day Seven Through Ten  1. Most individuals by this time are able to return to work or school unless otherwise instructed. Consider sending children back to school for a half day on the first day back.  General Anesthesia, Adult, Care After This sheet gives you information about how to care for yourself after your procedure. Your health care provider may also give you more specific instructions. If you have problems or questions, contact your health care provider. What can I expect after the procedure? After the procedure, the following side effects are common:  Pain or discomfort at the IV site.  Nausea.  Vomiting.  Sore throat.  Trouble concentrating.  Feeling cold or chills.  Weak or tired.  Sleepiness and fatigue.  Soreness and body aches. These side effects can affect parts of the body that were not involved in surgery. Follow these instructions at home:  For at least 24 hours after the procedure:  Have a responsible adult stay with you. It is important to have someone help care for you until you are awake and alert.  Rest as needed.  Do not: ? Participate in activities in which you could fall or become injured. ? Drive. ? Use heavy machinery. ? Drink alcohol. ? Take sleeping pills or medicines that cause drowsiness. ? Make important decisions or sign legal documents. ? Take care of children on your own. Eating  and drinking  Follow any instructions from your health care provider about eating or drinking restrictions.  When you feel hungry, start by eating small amounts of foods that are soft and easy to digest (bland), such as toast. Gradually return to your regular diet.  Drink enough fluid to keep your urine pale yellow.  If you vomit, rehydrate by drinking water, juice, or clear broth. General instructions  If you have sleep apnea, surgery and certain medicines can increase your risk for breathing problems. Follow instructions from your health care provider about wearing your sleep device: ? Anytime you are sleeping, including during daytime naps. ? While taking prescription pain medicines, sleeping medicines, or medicines that make you drowsy.  Return to your normal activities as   as told by your health care provider. Ask your health care provider what activities are safe for you.  Take over-the-counter and prescription medicines only as told by your health care provider.  If you smoke, do not smoke without supervision.  Keep all follow-up visits as told by your health care provider. This is important. Contact a health care provider if:  You have nausea or vomiting that does not get better with medicine.  You cannot eat or drink without vomiting.  You have pain that does not get better with medicine.  You are unable to pass urine.  You develop a skin rash.  You have a fever.  You have redness around your IV site that gets worse. Get help right away if:  You have difficulty breathing.  You have chest pain.  You have blood in your urine or stool, or you vomit blood. Summary  After the procedure, it is common to have a sore throat or nausea. It is also common to feel tired.  Have a responsible adult stay with you for the first 24 hours after general anesthesia. It is important to have someone help care for you until you are awake and alert.  When you feel hungry, start by  eating small amounts of foods that are soft and easy to digest (bland), such as toast. Gradually return to your regular diet.  Drink enough fluid to keep your urine pale yellow.  Return to your normal activities as told by your health care provider. Ask your health care provider what activities are safe for you. This information is not intended to replace advice given to you by your health care provider. Make sure you discuss any questions you have with your health care provider. Document Revised: 09/06/2017 Document Reviewed: 04/19/2017 Elsevier Patient Education  [place].   Scopolamine skin patches What is this medicine? SCOPOLAMINE (skoe POL a meen) is used to prevent nausea and vomiting caused by motion sickness, anesthesia and surgery. This medicine may be used for other purposes; ask your health care provider or pharmacist if you have questions. COMMON BRAND NAME(S): Transderm Scop What should I tell my health care provider before I take this medicine? They need to know if you have any of these conditions:  are scheduled to have a gastric secretion test  glaucoma  heart disease  kidney disease  liver disease  lung or breathing disease, like asthma  mental illness  prostate disease  seizures  stomach or intestine problems  trouble passing urine  an unusual or allergic reaction to scopolamine, atropine, other medicines, foods, dyes, or preservatives  pregnant or trying to get pregnant  breast-feeding How should I use this medicine? This medicine is for external use only. Follow the directions on the prescription label. Wear only 1 patch at a time. Choose an area behind the ear, that is clean, dry, hairless and free from any cuts or irritation. Wipe the area with a clean dry tissue. Peel off the plastic backing of the skin patch, trying not to touch the adhesive side with your hands. Do not cut the patches. Firmly apply to the area you have chosen, with the  metallic side of the patch to the skin and the tan-colored side showing. Once firmly in place, wash your hands well with soap and water. Do not get this medicine into your eyes. After removing the patch, wash your hands and the area behind your ear thoroughly with soap and water. The patch will still contain some  medicine after use. To avoid accidental contact or ingestion by children or pets, fold the used patch in half with the sticky side together and throw away in the trash out of the reach of children and pets. If you need to use a second patch after you remove the first, place it behind the other ear. A special MedGuide will be given to you by the pharmacist with each prescription and refill. Be sure to read this information carefully each time. Talk to your pediatrician regarding the use of this medicine in children. Special care may be needed. Overdosage: If you think you have taken too much of this medicine contact a poison control center or emergency room at once. NOTE: This medicine is only for you. Do not share this medicine with others. What if I miss a dose? This does not apply. This medicine is not for regular use. What may interact with this medicine?  alcohol  antihistamines for allergy cough and cold  atropine  certain medicines for anxiety or sleep  certain medicines for bladder problems like oxybutynin, tolterodine  certain medicines for depression like amitriptyline, fluoxetine, sertraline  certain medicines for stomach problems like dicyclomine, hyoscyamine  certain medicines for Parkinson's disease like benztropine, trihexyphenidyl  certain medicines for seizures like phenobarbital, primidone  general anesthetics like halothane, isoflurane, methoxyflurane, propofol  ipratropium  local anesthetics like lidocaine, pramoxine, tetracaine  medicines that relax muscles for surgery  phenothiazines like chlorpromazine, mesoridazine, prochlorperazine,  thioridazine  narcotic medicines for pain  other belladonna alkaloids This list may not describe all possible interactions. Give your health care provider a list of all the medicines, herbs, non-prescription drugs, or dietary supplements you use. Also tell them if you smoke, drink alcohol, or use illegal drugs. Some items may interact with your medicine. What should I watch for while using this medicine? Limit contact with water while swimming and bathing because the patch may fall off. If the patch falls off, throw it away and put a new one behind the other ear. You may get drowsy or dizzy. Do not drive, use machinery, or do anything that needs mental alertness until you know how this medicine affects you. Do not stand or sit up quickly, especially if you are an older patient. This reduces the risk of dizzy or fainting spells. Alcohol may interfere with the effect of this medicine. Avoid alcoholic drinks. Your mouth may get dry. Chewing sugarless gum or sucking hard candy, and drinking plenty of water may help. Contact your healthcare professional if the problem does not go away or is severe. This medicine may cause dry eyes and blurred vision. If you wear contact lenses, you may feel some discomfort. Lubricating drops may help. See your healthcare professional if the problem does not go away or is severe. If you are going to need surgery, an MRI, CT scan, or other procedure, tell your healthcare professional that you are using this medicine. You may need to remove the patch before the procedure. What side effects may I notice from receiving this medicine? Side effects that you should report to your doctor or health care professional as soon as possible:  allergic reactions like skin rash, itching or hives; swelling of the face, lips, or tongue  blurred vision  changes in vision  confusion  dizziness  eye pain  fast, irregular heartbeat  hallucinations, loss of contact with  reality  nausea, vomiting  pain or trouble passing urine  restlessness  seizures  skin irritation  stomach pain  Side effects that usually do not require medical attention (report to your doctor or health care professional if they continue or are bothersome):  drowsiness  dry mouth  headache  sore throat This list may not describe all possible side effects. Call your doctor for medical advice about side effects. You may report side effects to FDA at 1-800-FDA-1088. Where should I keep my medicine? Keep out of the reach of children. Store at room temperature between 20 and 25 degrees C (68 and 77 degrees F). Keep this medicine in the foil package until ready to use. Throw away any unused medicine after the expiration date. NOTE: This sheet is a summary. It may not cover all possible information. If you have questions about this medicine, talk to your doctor, pharmacist, or health care provider.  2020 Elsevier/Gold Standard (2017-11-22 16:14:46)

## 2020-01-06 NOTE — Op Note (Signed)
..  01/06/2020  7:55 AM    Howard-Harmych, Nicki [place]  [id number]   Pre-Op Dx:  chronic tonsillitis, tonsil and adenoid hypertrophy  Post-op Dx: same  Proc:Tonsillectomy and Adenoidectomy >age 24  Surg: Roney Mans Espiridion Supinski  Anes:  General Endotracheal  EBL:  <35ml  Comp:  None  Findings:  3+ cryptic tonsils, 2+ partially obstructive adenoids.  Adenoids were ablated so no specimen obtained  Procedure: After the patient was identified in holding and the history and physical and consent was reviewed, the patient was taken to the operating room and placed in a supine position.  General endotracheal anesthesia was induced in the normal fashion.  At this time, the patient was rotated 45 degrees and a shoulder roll was placed.  At this time, a McIvor mouthgag was inserted into the patient's oral cavity and suspended from the Mayo stand without injury to teeth, lips, or gums.  Next a red rubber catheter was inserted into the patient left nostril for retraction of the uvula and soft palate superiorly.  Next a curved Alice clamp was attached to the patient's right superior tonsillar pole and retracted medially and inferiorly.  A Bovie electrocautery was used to dissect the patient's right tonsil in a subcapsular plane.  Meticulous hemostasis was achieved with Bovie suction cautery.  At this time, the mouth gag was released from suspension for 1 minute.  Attention now was directed to the patient's left side.  In a similar fashion the curved Alice clamp was attached to the superior pole and this was retracted medially and inferiorly and the tonsil was excised in a subcapsular plane with Bovie electrocautery.  After completion of the second tonsil, meticulous hemostasis was continued.  At this time, attention was directed to the patient's Adenoidectomy.  Under indirect visualization using an operating mirror, the adenoid tissue was visualized and noted to be obstructive in nature.  Using a Bovie suction  cautery, the adenoid tissue was de bulked and debrided for a widely patent choana.  Following debulking, the remaining adenoid tissue was ablated and desiccated with Bovie suction cautery.  Meticulous hemostasis was continued.  At this time, the patient's nasal cavity and oral cavity was irrigated with sterile saline.  One ml of 0.25% Marcaine was injected into the anterior and posterior tonsillar fossa bilaterally.  Following this  The care of patient was returned to anesthesia, awakened, and transferred to recovery in stable condition.  Dispo:  PACU to home  Plan: Soft diet.  Limit exercise and strenuous activity for 2 weeks.  Fluid hydration  Recheck my office three weeks.   Roney Mans Alandis Bluemel 7:55 AM 01/06/2020

## 2020-01-06 NOTE — Anesthesia Procedure Notes (Signed)
Procedure Name: Intubation Date/Time: 01/06/2020 7:37 AM Performed by: Georga Bora, CRNA Pre-anesthesia Checklist: Patient identified, Emergency Drugs available, Suction available, Patient being monitored and Timeout performed Patient Re-evaluated:Patient Re-evaluated prior to induction Oxygen Delivery Method: Circle system utilized Preoxygenation: Pre-oxygenation with 100% oxygen Induction Type: IV induction Ventilation: Mask ventilation without difficulty Laryngoscope Size: Mac and 3 Grade View: Grade I Tube type: Oral Rae Tube size: 7.0 mm Number of attempts: 1 Placement Confirmation: ETT inserted through vocal cords under direct vision,  positive ETCO2 and breath sounds checked- equal and bilateral Tube secured with: Tape Dental Injury: Teeth and Oropharynx as per pre-operative assessment

## 2020-01-06 NOTE — H&P (Signed)
..  History and Physical paper copy reviewed and updated date of procedure and will be scanned into system.  Patient seen and examined.  

## 2020-01-06 NOTE — Anesthesia Preprocedure Evaluation (Signed)
Anesthesia Evaluation  Patient identified by MRN, date of birth, ID band Patient awake    Reviewed: Allergy & Precautions, NPO status , Patient's Chart, lab work & pertinent test results  History of Anesthesia Complications (+) PONV, Family history of anesthesia reaction and history of anesthetic complications  Airway Mallampati: II  TM Distance: >3 FB     Dental  (+) Teeth Intact   Pulmonary Current Smoker and Patient abstained from smoking.,    Pulmonary exam normal breath sounds clear to auscultation       Cardiovascular Exercise Tolerance: Good negative cardio ROS Normal cardiovascular exam Rhythm:Regular Rate:Normal     Neuro/Psych  Headaches, PSYCHIATRIC DISORDERS Anxiety Depression    GI/Hepatic negative GI ROS, Neg liver ROS,   Endo/Other  negative endocrine ROS  Renal/GU negative Renal ROS  negative genitourinary   Musculoskeletal negative musculoskeletal ROS (+)   Abdominal Normal abdominal exam  (+) - obese,  Abdomen: soft.    Peds negative pediatric ROS (+)  Hematology negative hematology ROS (+)   Anesthesia Other Findings   Reproductive/Obstetrics negative OB ROS                             Anesthesia Physical  Anesthesia Plan  ASA: II  Anesthesia Plan: General   Post-op Pain Management:    Induction: Intravenous  PONV Risk Score and Plan: 4 or greater and Propofol infusion, Ondansetron, Dexamethasone and Treatment may vary due to age or medical condition  Airway Management Planned: Oral ETT  Additional Equipment:   Intra-op Plan:   Post-operative Plan:   Informed Consent: I have reviewed the patients History and Physical, chart, labs and discussed the procedure including the risks, benefits and alternatives for the proposed anesthesia with the patient or authorized representative who has indicated his/her understanding and acceptance.     Dental  advisory given  Plan Discussed with: CRNA and Anesthesiologist  Anesthesia Plan Comments:         Anesthesia Quick Evaluation  Patient Active Problem List   Diagnosis Date Noted  . Anxiety and depression 09/04/2019    CBC Latest Ref Rng & Units 05/18/2019  WBC 4.0 - 10.5 K/uL 12.3(H)  Hemoglobin 12.0 - 15.0 g/dL 31.5  Hematocrit 17.6 - 46.0 % 42.3  Platelets 150 - 400 K/uL 336   BMP Latest Ref Rng & Units 05/18/2019  Glucose 70 - 99 mg/dL 92  BUN 6 - 20 mg/dL 7  Creatinine 1.60 - 7.37 mg/dL 1.06  Sodium 269 - 485 mmol/L 138  Potassium 3.5 - 5.1 mmol/L 4.0  Chloride 98 - 111 mmol/L 105  CO2 22 - 32 mmol/L 23  Calcium 8.9 - 10.3 mg/dL 9.4    Risks and benefits of anesthesia discussed at length, patient or surrogate demonstrates understanding. Appropriately NPO. Plan to proceed with anesthesia.  Corlis Leak, MD 01/06/20

## 2020-01-06 NOTE — Anesthesia Postprocedure Evaluation (Signed)
Anesthesia Post Note  Patient: Michele Bell  Procedure(s) Performed: TONSILLECTOMY , ADENOIDECTOMY-ANDENOIDS CAUTERIZED, NO SPECIMEN (Bilateral Throat)     Patient location during evaluation: PACU Anesthesia Type: General Level of consciousness: awake, awake and alert and oriented Pain management: pain level controlled Vital Signs Assessment: post-procedure vital signs reviewed and stable Respiratory status: spontaneous breathing, nonlabored ventilation and respiratory function stable Cardiovascular status: blood pressure returned to baseline and stable Postop Assessment: no headache and no backache Anesthetic complications: no    Edwyna Ready

## 2020-01-07 LAB — SURGICAL PATHOLOGY

## 2020-02-05 HISTORY — PX: TONSILLECTOMY: SUR1361

## 2020-02-08 ENCOUNTER — Other Ambulatory Visit: Payer: Self-pay

## 2020-02-08 ENCOUNTER — Emergency Department
Admission: EM | Admit: 2020-02-08 | Discharge: 2020-02-08 | Disposition: A | Discharge status: Home / Self Care | Payer: 59 | Attending: Emergency Medicine | Admitting: Emergency Medicine

## 2020-02-08 ENCOUNTER — Emergency Department: Payer: 59

## 2020-02-08 DIAGNOSIS — R69 Illness, unspecified: Secondary | ICD-10-CM | POA: Diagnosis not present

## 2020-02-08 DIAGNOSIS — S61211S Laceration without foreign body of left index finger without damage to nail, sequela: Secondary | ICD-10-CM | POA: Insufficient documentation

## 2020-02-08 DIAGNOSIS — Z79899 Other long term (current) drug therapy: Secondary | ICD-10-CM | POA: Insufficient documentation

## 2020-02-08 DIAGNOSIS — Z91040 Latex allergy status: Secondary | ICD-10-CM | POA: Diagnosis not present

## 2020-02-08 DIAGNOSIS — W260XXS Contact with knife, sequela: Secondary | ICD-10-CM | POA: Diagnosis not present

## 2020-02-08 DIAGNOSIS — S6992XA Unspecified injury of left wrist, hand and finger(s), initial encounter: Secondary | ICD-10-CM | POA: Diagnosis not present

## 2020-02-08 DIAGNOSIS — F17290 Nicotine dependence, other tobacco product, uncomplicated: Secondary | ICD-10-CM | POA: Insufficient documentation

## 2020-02-08 DIAGNOSIS — S61211A Laceration without foreign body of left index finger without damage to nail, initial encounter: Secondary | ICD-10-CM | POA: Diagnosis not present

## 2020-02-08 MED ORDER — SULFAMETHOXAZOLE-TRIMETHOPRIM 800-160 MG PO TABS: 1.0000 | ORAL_TABLET | Freq: Two times a day (BID) | ORAL | 0 refills | Status: DC
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
## 2020-02-08 NOTE — Discharge Instructions (Signed)
You likely have injured the tendon or the nerve in your finger.  I do not currently see any signs of infection but please watch out for any signs of infection including increased swelling, redness, drainage, fevers.  Please call hand orthopedics for a follow-up appointment as soon as possible for reevaluation.  If you cannot get an appointment with hand orthopedics, you may follow-up with orthopedics.
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
## 2020-02-11 ENCOUNTER — Emergency Department
Admission: EM | Admit: 2020-02-11 | Discharge: 2020-02-11 | Disposition: A | Discharge status: Home / Self Care | Payer: 59 | Attending: Emergency Medicine | Admitting: Emergency Medicine

## 2020-02-11 ENCOUNTER — Encounter: Payer: Self-pay | Admitting: Emergency Medicine

## 2020-02-11 ENCOUNTER — Emergency Department: Payer: 59

## 2020-02-11 ENCOUNTER — Other Ambulatory Visit: Payer: Self-pay

## 2020-02-11 DIAGNOSIS — M79645 Pain in left finger(s): Secondary | ICD-10-CM | POA: Diagnosis not present

## 2020-02-11 DIAGNOSIS — F17290 Nicotine dependence, other tobacco product, uncomplicated: Secondary | ICD-10-CM | POA: Insufficient documentation

## 2020-02-11 DIAGNOSIS — Z79899 Other long term (current) drug therapy: Secondary | ICD-10-CM | POA: Diagnosis not present

## 2020-02-11 DIAGNOSIS — J189 Pneumonia, unspecified organism: Secondary | ICD-10-CM | POA: Diagnosis not present

## 2020-02-11 DIAGNOSIS — J168 Pneumonia due to other specified infectious organisms: Secondary | ICD-10-CM | POA: Diagnosis not present

## 2020-02-11 DIAGNOSIS — M79644 Pain in right finger(s): Secondary | ICD-10-CM | POA: Diagnosis not present

## 2020-02-11 DIAGNOSIS — Z7289 Other problems related to lifestyle: Secondary | ICD-10-CM

## 2020-02-11 DIAGNOSIS — Z91040 Latex allergy status: Secondary | ICD-10-CM | POA: Diagnosis not present

## 2020-02-11 DIAGNOSIS — R079 Chest pain, unspecified: Secondary | ICD-10-CM | POA: Diagnosis not present

## 2020-02-11 DIAGNOSIS — U070 Vaping-related disorder: Secondary | ICD-10-CM | POA: Diagnosis not present

## 2020-02-11 DIAGNOSIS — R69 Illness, unspecified: Secondary | ICD-10-CM | POA: Diagnosis not present

## 2020-02-11 LAB — CBC WITH DIFFERENTIAL/PLATELET
Abs Immature Granulocytes: 0.01 10*3/uL (ref 0.00–0.07)
Basophils Absolute: 0 10*3/uL (ref 0.0–0.1)
Basophils Relative: 0 %
Eosinophils Absolute: 0.3 10*3/uL (ref 0.0–0.5)
Eosinophils Relative: 5 %
HCT: 43.2 % (ref 36.0–46.0)
Hemoglobin: 13.9 g/dL (ref 12.0–15.0)
Immature Granulocytes: 0 %
Lymphocytes Relative: 19 %
Lymphs Abs: 1.1 10*3/uL (ref 0.7–4.0)
MCH: 28.5 pg (ref 26.0–34.0)
MCHC: 32.2 g/dL (ref 30.0–36.0)
MCV: 88.7 fL (ref 80.0–100.0)
Monocytes Absolute: 0.4 10*3/uL (ref 0.1–1.0)
Monocytes Relative: 7 %
Neutro Abs: 3.8 10*3/uL (ref 1.7–7.7)
Neutrophils Relative %: 69 %
Platelets: 285 10*3/uL (ref 150–400)
RBC: 4.87 MIL/uL (ref 3.87–5.11)
RDW: 14.6 % (ref 11.5–15.5)
WBC: 5.6 10*3/uL (ref 4.0–10.5)
nRBC: 0 % (ref 0.0–0.2)

## 2020-02-11 LAB — COMPREHENSIVE METABOLIC PANEL
ALT: 10 U/L (ref 0–44)
AST: 15 U/L (ref 15–41)
Albumin: 3.7 g/dL (ref 3.5–5.0)
Alkaline Phosphatase: 41 U/L (ref 38–126)
Anion gap: 6 (ref 5–15)
BUN: <5 mg/dL — ABNORMAL LOW (ref 6–20)
CO2: 23 mmol/L (ref 22–32)
Calcium: 8.6 mg/dL — ABNORMAL LOW (ref 8.9–10.3)
Chloride: 108 mmol/L (ref 98–111)
Creatinine, Ser: 0.46 mg/dL (ref 0.44–1.00)
GFR calc Af Amer: >60 mL/min (ref >60)
GFR calc non Af Amer: >60 mL/min (ref >60)
Glucose, Bld: 102 mg/dL — ABNORMAL HIGH (ref 70–99)
Potassium: 3.8 mmol/L (ref 3.5–5.1)
Sodium: 137 mmol/L (ref 135–145)
Total Bilirubin: 0.4 mg/dL (ref 0.3–1.2)
Total Protein: 7.6 g/dL (ref 6.5–8.1)

## 2020-02-11 LAB — FIBRIN DERIVATIVES D-DIMER (ARMC ONLY): Fibrin derivatives D-dimer (ARMC): 584.24 ng/mL (FEU) — ABNORMAL HIGH (ref 0.00–499.00)

## 2020-02-11 LAB — TROPONIN I (HIGH SENSITIVITY): Troponin I (High Sensitivity): 2 ng/L (ref <18)

## 2020-02-11 MED ORDER — AZITHROMYCIN 250 MG PO TABS
ORAL_TABLET | ORAL | 0 refills | Status: DC
Start: 2020-02-11 — End: 2020-09-21

## 2020-02-11 MED ORDER — IOHEXOL 350 MG/ML SOLN
75.0000 mL | Freq: Once | INTRAVENOUS | Status: AC | PRN
  Administered 2020-02-11: 75 mL via INTRAVENOUS
  Filled 2020-02-11: qty 75
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
## 2020-02-11 NOTE — ED Notes (Signed)
Informed provider that she was having chest pain  Has appt with ortho this afternoon about her finger

## 2020-02-11 NOTE — Discharge Instructions (Signed)
Follow-up with your primary care provider for recheck of your pneumonia and chest wall pain.  Continue taking the Bactrim DS twice a day until completely finished.  A second antibiotic Zithromax was sent to your pharmacy should be taken along with your current antibiotic.
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
## 2020-02-12 ENCOUNTER — Other Ambulatory Visit: Payer: Self-pay | Admitting: Orthopedic Surgery

## 2020-02-12 ENCOUNTER — Encounter (HOSPITAL_BASED_OUTPATIENT_CLINIC_OR_DEPARTMENT_OTHER): Payer: Self-pay | Admitting: Orthopedic Surgery

## 2020-02-12 ENCOUNTER — Other Ambulatory Visit: Payer: Self-pay

## 2020-02-12 DIAGNOSIS — S61211A Laceration without foreign body of left index finger without damage to nail, initial encounter: Secondary | ICD-10-CM | POA: Diagnosis not present

## 2020-02-12 NOTE — Progress Notes (Signed)
Dr Mal Amabile notified of patient went to ED 5/27 and dx with pneumonia. Patient recently had T&A surgery on 4/21. Patient denies having any symptoms. Patient is on Zithromax and will complete medication before 6/3 surgery. OK to continue with surgery per Dr Mal Amabile

## 2020-02-16 ENCOUNTER — Other Ambulatory Visit (HOSPITAL_COMMUNITY)
Admission: RE | Admit: 2020-02-16 | Discharge: 2020-02-16 | Disposition: A | Discharge status: Home / Self Care | Payer: 59 | Source: Ambulatory Visit | Attending: Orthopedic Surgery | Admitting: Orthopedic Surgery

## 2020-02-16 DIAGNOSIS — Z01812 Encounter for preprocedural laboratory examination: Secondary | ICD-10-CM | POA: Diagnosis not present

## 2020-02-16 DIAGNOSIS — Z20822 Contact with and (suspected) exposure to covid-19: Secondary | ICD-10-CM | POA: Diagnosis not present

## 2020-02-16 LAB — SARS CORONAVIRUS 2 (TAT 6-24 HRS): SARS Coronavirus 2: NEGATIVE

## 2020-02-18 ENCOUNTER — Ambulatory Visit (HOSPITAL_BASED_OUTPATIENT_CLINIC_OR_DEPARTMENT_OTHER)
Admission: RE | Admit: 2020-02-18 | Discharge: 2020-02-18 | Disposition: A | Discharge status: Home / Self Care | Payer: 59 | Attending: Orthopedic Surgery | Admitting: Orthopedic Surgery

## 2020-02-18 ENCOUNTER — Encounter (HOSPITAL_BASED_OUTPATIENT_CLINIC_OR_DEPARTMENT_OTHER)
Admission: RE | Disposition: A | Discharge status: Home / Self Care | Payer: Self-pay | Source: Home / Self Care | Attending: Orthopedic Surgery

## 2020-02-18 ENCOUNTER — Other Ambulatory Visit: Payer: Self-pay

## 2020-02-18 ENCOUNTER — Ambulatory Visit (HOSPITAL_BASED_OUTPATIENT_CLINIC_OR_DEPARTMENT_OTHER): Payer: 59 | Admitting: Anesthesiology

## 2020-02-18 ENCOUNTER — Encounter (HOSPITAL_BASED_OUTPATIENT_CLINIC_OR_DEPARTMENT_OTHER): Payer: Self-pay | Admitting: Orthopedic Surgery

## 2020-02-18 DIAGNOSIS — S61211A Laceration without foreign body of left index finger without damage to nail, initial encounter: Secondary | ICD-10-CM | POA: Insufficient documentation

## 2020-02-18 DIAGNOSIS — K589 Irritable bowel syndrome without diarrhea: Secondary | ICD-10-CM | POA: Insufficient documentation

## 2020-02-18 DIAGNOSIS — Z885 Allergy status to narcotic agent status: Secondary | ICD-10-CM | POA: Insufficient documentation

## 2020-02-18 DIAGNOSIS — S64491A Injury of digital nerve of left index finger, initial encounter: Secondary | ICD-10-CM | POA: Insufficient documentation

## 2020-02-18 DIAGNOSIS — S66121A Laceration of flexor muscle, fascia and tendon of left index finger at wrist and hand level, initial encounter: Secondary | ICD-10-CM | POA: Diagnosis not present

## 2020-02-18 DIAGNOSIS — F17290 Nicotine dependence, other tobacco product, uncomplicated: Secondary | ICD-10-CM | POA: Diagnosis not present

## 2020-02-18 DIAGNOSIS — R69 Illness, unspecified: Secondary | ICD-10-CM | POA: Diagnosis not present

## 2020-02-18 DIAGNOSIS — Z79899 Other long term (current) drug therapy: Secondary | ICD-10-CM | POA: Insufficient documentation

## 2020-02-18 DIAGNOSIS — F419 Anxiety disorder, unspecified: Secondary | ICD-10-CM | POA: Diagnosis not present

## 2020-02-18 DIAGNOSIS — Z87892 Personal history of anaphylaxis: Secondary | ICD-10-CM | POA: Diagnosis not present

## 2020-02-18 DIAGNOSIS — F329 Major depressive disorder, single episode, unspecified: Secondary | ICD-10-CM | POA: Diagnosis not present

## 2020-02-18 DIAGNOSIS — Z91040 Latex allergy status: Secondary | ICD-10-CM | POA: Insufficient documentation

## 2020-02-18 DIAGNOSIS — Z793 Long term (current) use of hormonal contraceptives: Secondary | ICD-10-CM | POA: Diagnosis not present

## 2020-02-18 DIAGNOSIS — Z91013 Allergy to seafood: Secondary | ICD-10-CM | POA: Diagnosis not present

## 2020-02-18 DIAGNOSIS — W260XXA Contact with knife, initial encounter: Secondary | ICD-10-CM | POA: Diagnosis not present

## 2020-02-18 HISTORY — DX: Pneumonia, unspecified organism: J18.9

## 2020-02-18 HISTORY — PX: NERVE REPAIR: SHX2083

## 2020-02-18 HISTORY — PX: WOUND EXPLORATION: SHX6188

## 2020-02-18 LAB — POCT PREGNANCY, URINE: Preg Test, Ur: NEGATIVE

## 2020-02-18 SURGERY — WOUND EXPLORATION
Anesthesia: Monitor Anesthesia Care | Site: Finger | Laterality: Left

## 2020-02-18 MED ORDER — OXYCODONE HCL 5 MG PO TABS
ORAL_TABLET | ORAL | Status: AC
  Filled 2020-02-18: qty 1

## 2020-02-18 MED ORDER — LIDOCAINE 2% (20 MG/ML) 5 ML SYRINGE
INTRAMUSCULAR | Status: DC | PRN
  Administered 2020-02-18: 100 mg via INTRAVENOUS

## 2020-02-18 MED ORDER — ONDANSETRON HCL 4 MG/2ML IJ SOLN
INTRAMUSCULAR | Status: DC | PRN
  Administered 2020-02-18: 4 mg via INTRAVENOUS

## 2020-02-18 MED ORDER — LIDOCAINE 2% (20 MG/ML) 5 ML SYRINGE
INTRAMUSCULAR | Status: AC
  Filled 2020-02-18: qty 5

## 2020-02-18 MED ORDER — MIDAZOLAM HCL 5 MG/5ML IJ SOLN
INTRAMUSCULAR | Status: DC | PRN
  Administered 2020-02-18: 2 mg via INTRAVENOUS

## 2020-02-18 MED ORDER — LACTATED RINGERS IV SOLN: INTRAVENOUS | Status: DC

## 2020-02-18 MED ORDER — PROPOFOL 10 MG/ML IV BOLUS
INTRAVENOUS | Status: DC | PRN
  Administered 2020-02-18: 200 mg via INTRAVENOUS

## 2020-02-18 MED ORDER — CEFAZOLIN SODIUM-DEXTROSE 2-4 GM/100ML-% IV SOLN
INTRAVENOUS | Status: AC
  Filled 2020-02-18: qty 100

## 2020-02-18 MED ORDER — OXYCODONE HCL 5 MG PO TABS
5.0000 mg | ORAL_TABLET | Freq: Once | ORAL | Status: AC | PRN
  Administered 2020-02-18: 5 mg via ORAL

## 2020-02-18 MED ORDER — DEXAMETHASONE SODIUM PHOSPHATE 10 MG/ML IJ SOLN
INTRAMUSCULAR | Status: AC
  Filled 2020-02-18: qty 1

## 2020-02-18 MED ORDER — FENTANYL CITRATE (PF) 100 MCG/2ML IJ SOLN
INTRAMUSCULAR | Status: DC | PRN
  Administered 2020-02-18 (×3): 50 ug via INTRAVENOUS

## 2020-02-18 MED ORDER — ONDANSETRON HCL 4 MG/2ML IJ SOLN: 4.0000 mg | Freq: Once | INTRAMUSCULAR | Status: DC | PRN

## 2020-02-18 MED ORDER — DEXAMETHASONE SODIUM PHOSPHATE 10 MG/ML IJ SOLN
INTRAMUSCULAR | Status: DC | PRN
  Administered 2020-02-18: 10 mg via INTRAVENOUS

## 2020-02-18 MED ORDER — CEFAZOLIN SODIUM-DEXTROSE 2-4 GM/100ML-% IV SOLN
2.0000 g | INTRAVENOUS | Status: AC
  Administered 2020-02-18: 2 g via INTRAVENOUS

## 2020-02-18 MED ORDER — FENTANYL CITRATE (PF) 100 MCG/2ML IJ SOLN
INTRAMUSCULAR | Status: AC
  Filled 2020-02-18: qty 2

## 2020-02-18 MED ORDER — FENTANYL CITRATE (PF) 100 MCG/2ML IJ SOLN: 25.0000 ug | INTRAMUSCULAR | Status: DC | PRN

## 2020-02-18 MED ORDER — BUPIVACAINE HCL (PF) 0.25 % IJ SOLN
INTRAMUSCULAR | Status: DC | PRN
  Administered 2020-02-18: 8 mL

## 2020-02-18 MED ORDER — OXYCODONE HCL 5 MG/5ML PO SOLN: 5.0000 mg | Freq: Once | ORAL | Status: AC | PRN

## 2020-02-18 MED ORDER — MIDAZOLAM HCL 2 MG/2ML IJ SOLN
INTRAMUSCULAR | Status: AC
  Filled 2020-02-18: qty 2

## 2020-02-18 MED ORDER — ONDANSETRON HCL 4 MG/2ML IJ SOLN
INTRAMUSCULAR | Status: AC
  Filled 2020-02-18: qty 2

## 2020-02-18 MED ORDER — OXYCODONE HCL 5 MG PO TABS: ORAL_TABLET | ORAL | 0 refills | Status: DC

## 2020-02-18 SURGICAL SUPPLY — 68 items
BAG DECANTER FOR FLEXI CONT (MISCELLANEOUS) IMPLANT
BLADE MINI RND TIP GREEN BEAV (BLADE) IMPLANT
BLADE SURG 15 STRL LF DISP TIS (BLADE) ×4 IMPLANT
BLADE SURG 15 STRL SS (BLADE) ×2
BNDG ELASTIC 3X5.8 VLCR STR LF (GAUZE/BANDAGES/DRESSINGS) ×3 IMPLANT
BNDG ESMARK 4X9 LF (GAUZE/BANDAGES/DRESSINGS) ×1 IMPLANT
BNDG GAUZE ELAST 4 BULKY (GAUZE/BANDAGES/DRESSINGS) ×3 IMPLANT
BRUSH SCRUB EZ PLAIN DRY (MISCELLANEOUS) ×2 IMPLANT
CATH ROBINSON RED A/P 10FR (CATHETERS) IMPLANT
CHLORAPREP W/TINT 26 (MISCELLANEOUS) ×3 IMPLANT
CORD BIPOLAR FORCEPS 12FT (ELECTRODE) ×3 IMPLANT
COVER BACK TABLE 60X90IN (DRAPES) ×3 IMPLANT
COVER MAYO STAND STRL (DRAPES) ×3 IMPLANT
COVER WAND RF STERILE (DRAPES) IMPLANT
CUFF TOURN SGL QUICK 18X4 (TOURNIQUET CUFF) ×1 IMPLANT
DECANTER SPIKE VIAL GLASS SM (MISCELLANEOUS) ×3 IMPLANT
DRAPE EXTREMITY T 121X128X90 (DISPOSABLE) ×3 IMPLANT
DRAPE SURG 17X23 STRL (DRAPES) ×3 IMPLANT
GAUZE SPONGE 4X4 12PLY STRL (GAUZE/BANDAGES/DRESSINGS) ×3 IMPLANT
GAUZE XEROFORM 1X8 LF (GAUZE/BANDAGES/DRESSINGS) ×3 IMPLANT
GLOVE BIO SURGEON STRL SZ7.5 (GLOVE) ×3 IMPLANT
GLOVE BIOGEL PI IND STRL 6.5 (GLOVE) IMPLANT
GLOVE BIOGEL PI IND STRL 8 (GLOVE) ×2 IMPLANT
GLOVE BIOGEL PI IND STRL 8.5 (GLOVE) IMPLANT
GLOVE BIOGEL PI IND STRL 9 (GLOVE) IMPLANT
GLOVE BIOGEL PI INDICATOR 6.5 (GLOVE) ×2
GLOVE BIOGEL PI INDICATOR 8 (GLOVE) ×1
GLOVE BIOGEL PI INDICATOR 8.5 (GLOVE) ×1
GLOVE BIOGEL PI INDICATOR 9 (GLOVE) ×2
GLOVE SURG ORTHO 8.0 STRL STRW (GLOVE) ×1 IMPLANT
GLOVE SURG SS PI 6.5 STRL IVOR (GLOVE) ×2 IMPLANT
GOWN SRG XL 47XLVL 3 REINF (GOWN DISPOSABLE) IMPLANT
GOWN STRL REIN XL LVL3 (GOWN DISPOSABLE) ×1
GOWN STRL REUS W/ TWL LRG LVL3 (GOWN DISPOSABLE) ×2 IMPLANT
GOWN STRL REUS W/TWL LRG LVL3 (GOWN DISPOSABLE) ×2
GOWN STRL REUS W/TWL XL LVL3 (GOWN DISPOSABLE) ×3 IMPLANT
LOOP VESSEL MAXI BLUE (MISCELLANEOUS) IMPLANT
NDL HYPO 25X1 1.5 SAFETY (NEEDLE) IMPLANT
NDL SAFETY ECLIPSE 18X1.5 (NEEDLE) IMPLANT
NEEDLE HYPO 18GX1.5 SHARP (NEEDLE) ×1
NEEDLE HYPO 25X1 1.5 SAFETY (NEEDLE) IMPLANT
NS IRRIG 1000ML POUR BTL (IV SOLUTION) ×3 IMPLANT
PAD CAST 3X4 CTTN HI CHSV (CAST SUPPLIES) ×2 IMPLANT
PAD CAST 4YDX4 CTTN HI CHSV (CAST SUPPLIES) IMPLANT
PADDING CAST ABS 4INX4YD NS (CAST SUPPLIES) ×1
PADDING CAST ABS COTTON 4X4 ST (CAST SUPPLIES) ×2 IMPLANT
PADDING CAST COTTON 3X4 STRL (CAST SUPPLIES) ×1
PADDING CAST COTTON 4X4 STRL (CAST SUPPLIES)
SET BASIN DAY SURGERY F.S. (CUSTOM PROCEDURE TRAY) ×3 IMPLANT
SLEEVE SCD COMPRESS KNEE MED (MISCELLANEOUS) ×1 IMPLANT
SPEAR EYE SURG WECK-CEL (MISCELLANEOUS) ×3 IMPLANT
SPLINT PLASTER CAST XFAST 3X15 (CAST SUPPLIES) IMPLANT
SPLINT PLASTER XTRA FASTSET 3X (CAST SUPPLIES) ×15
STOCKINETTE 4X48 STRL (DRAPES) ×3 IMPLANT
SUT ETHIBOND 3-0 V-5 (SUTURE) IMPLANT
SUT ETHILON 4 0 PS 2 18 (SUTURE) ×3 IMPLANT
SUT FIBERWIRE 4-0 18 TAPR NDL (SUTURE)
SUT NYLON 9 0 VRM6 (SUTURE) ×1 IMPLANT
SUT PROLENE 6 0 P 1 18 (SUTURE) IMPLANT
SUT SILK 4 0 PS 2 (SUTURE) IMPLANT
SUT SUPRAMID 4-0 (SUTURE) IMPLANT
SUT VICRYL 4-0 PS2 18IN ABS (SUTURE) IMPLANT
SUTURE FIBERWR 4-0 18 TAPR NDL (SUTURE) IMPLANT
SYR BULB EAR ULCER 3OZ GRN STR (SYRINGE) ×3 IMPLANT
SYR CONTROL 10ML LL (SYRINGE) IMPLANT
TOWEL GREEN STERILE FF (TOWEL DISPOSABLE) ×6 IMPLANT
TRAY DSU PREP LF (CUSTOM PROCEDURE TRAY) IMPLANT
UNDERPAD 30X36 HEAVY ABSORB (UNDERPADS AND DIAPERS) ×3 IMPLANT

## 2020-02-18 NOTE — Transfer of Care (Signed)
Immediate Anesthesia Transfer of Care Note  Patient: Michele Bell  Procedure(s) Performed: LEFT INDEX FINGER WOUND EXPLORATION (Left Finger) NERVE REPAIR (Left Finger)  Patient Location: PACU  Anesthesia Type:General  Level of Consciousness: drowsy  Airway & Oxygen Therapy: Patient Spontanous Breathing and Patient connected to face mask oxygen  Post-op Assessment: Report given to RN and Post -op Vital signs reviewed and stable  Post vital signs: Reviewed and stable  Last Vitals:  Vitals Value Taken Time  BP    Temp    Pulse 51 02/18/20 1309  Resp    SpO2 98 % 02/18/20 1309  Vitals shown include unvalidated device data.  Last Pain:  Vitals:   02/18/20 1051  TempSrc: Oral  PainSc: 4          Complications: No apparent anesthesia complications

## 2020-02-18 NOTE — Discharge Instructions (Addendum)
Oxycodone given at 2:00pm. Next dose can be given at 8:00pm if needed.   [facility] Instructions Hand Surgery  Wound Care: Keep your hand elevated above the level of your heart.  Do not allow it to dangle by your side.  Keep the dressing dry and do not remove it unless your doctor advises you to do so.  He will usually change it at the time of your post-op visit.  Moving your fingers is advised to stimulate circulation but will depend on the site of your surgery.  If you have a splint applied, your doctor will advise you regarding movement.  Activity: Do not drive or operate machinery today.  Rest today and then you may return to your normal activity and work as indicated by your physician.  Diet:  Drink liquids today or eat a light diet.  You may resume a regular diet tomorrow.    General expectations: Pain for two to three days. Fingers may become slightly swollen.  Call your doctor if any of the following occur: Severe pain not relieved by pain medication. Elevated temperature. Dressing soaked with blood. Inability to move fingers. White or bluish color to fingers.    Post Anesthesia Home Care Instructions  Activity: Get plenty of rest for the remainder of the day. A responsible individual must stay with you for 24 hours following the procedure.  For the next 24 hours, DO NOT: -Drive a car -Advertising copywriter -Drink alcoholic beverages -Take any medication unless instructed by your physician -Make any legal decisions or sign important papers.  Meals: Start with liquid foods such as gelatin or soup. Progress to regular foods as tolerated. Avoid greasy, spicy, heavy foods. If nausea and/or vomiting occur, drink only clear liquids until the nausea and/or vomiting subsides. Call your physician if vomiting continues.  Special Instructions/Symptoms: Your throat may feel dry or sore from the anesthesia or the breathing tube placed in your throat during surgery. If this causes  discomfort, gargle with warm salt water. The discomfort should disappear within 24 hours.

## 2020-02-18 NOTE — Op Note (Signed)
NAME: Elyanah Farino [facility] MEDICAL RECORD NO: [id number] DATE OF BIRTH: 1995/10/23 FACILITY: [facility] LOCATION: [facility] PHYSICIAN: Tami Ribas, MD   OPERATIVE REPORT   DATE OF PROCEDURE: 02/18/20    PREOPERATIVE DIAGNOSIS:   Left index finger laceration with possible tendon artery nerve laceration   POSTOPERATIVE DIAGNOSIS:   1.  Left index finger radial digital nerve partial laceration 2.  Left index finger FDS partial laceration   PROCEDURE:   1.  Left index finger exploration of wound with repair of radial digital nerve partial laceration under microscope   SURGEON:  Betha Loa, M.D.   ASSISTANT: Cindee Salt, MD   ANESTHESIA:  General   INTRAVENOUS FLUIDS:  Per anesthesia flow sheet.   ESTIMATED BLOOD LOSS:  Minimal.   COMPLICATIONS:  None.   SPECIMENS:  none   TOURNIQUET TIME:    Total Tourniquet Time Documented: Upper Arm (Left) - 41 minutes Total: Upper Arm (Left) - 41 minutes    DISPOSITION:  Stable to PACU.   INDICATIONS: 24 year old female states proximally 2 weeks ago she sustained a laceration to the left index finger from X-Acto knife.  She notes decreased sensation in the end of the finger.  She wishes to proceed with operative exploration and repair of tendon artery nerve is necessary. Risks, benefits and alternatives of surgery were discussed including the risks of blood loss, infection, damage to nerves, vessels, tendons, ligaments, bone for surgery, need for additional surgery, complications with wound healing, continued pain, stiffness.  She voiced understanding of these risks and elected to proceed.  OPERATIVE COURSE:  After being identified preoperatively by myself,  the patient and I agreed on the procedure and site of the procedure.  The surgical site was marked.  Surgical consent had been signed. She was given IV antibiotics as preoperative antibiotic prophylaxis. She was transferred to the operating room and placed on the  operating table in supine position with the Left Left upper extremity on an arm board.  General anesthesia was induced by the anesthesiologist.  Left upper extremity was prepped and draped in normal sterile orthopedic fashion.  A surgical pause was performed between the surgeons, anesthesia, and operating room staff and all were in agreement as to the patient, procedure, and site of procedure.  Tourniquet at the proximal aspect of the extremity was inflated to 250 mmHg after exsanguination of the arm with an Esmarch bandage.    Bruner incision was made on the left index finger incorporating the traumatic wound.  This was carried in subcutaneous tissues by spreading technique.  Bipolar electrocautery was used to obtain hemostasis.  The flexor sheath was identified.  There is small amount of blood at the radial side.  This was opened at that area.  There was a partial injury to the radial arm of the FDS tendon.  This was not large enough to need repair.  No other laceration of the tendon was noted either yet the FDS or FDP tendon.  The ulnar digital nerve and artery were identified and were intact.  The radial digital nerve and artery were identified.  They appeared intact but the nerve was somewhat bulbous.  The microscope was brought in.  This was used for microdissection of the radial digital nerve and artery.  The radial digital artery was intact.  The radial digital nerve had a partial laceration of one of the fascicles.  There was enlargement in this area.  This enlarged area was resected and the fascicle repaired with  9-0 nylon suture in a interrupted fashion.  Good reapproximation was obtained.  The wound was copiously irrigated with sterile saline.  Was closed with 4-0 nylon in a horizontal mattress fashion.  Digital block was performed with quarter percent plain Marcaine to aid in postoperative analgesia.  The wound was dressed with sterile Xeroform 4 x 4's and wrapped with a Kerlix bandage.  Dorsal  blocking splint was placed with the wrist approximately 30 degrees flexed the MPs flexed and the IP is extended.  This was wrapped with Kerlix and Ace bandage.  The tourniquet was deflated at 41 minutes.  Fingertips were pink with brisk capillary refill after deflation of tourniquet.  The operative  drapes were broken down.  The patient was awoken from anesthesia safely.  She was transferred back to the stretcher and taken to PACU in stable condition.  I will see her back in the office in 1 week for postoperative followup.  I will give her a prescription for Oxycodone 5 mg 1-2 p.o. every 6 hours.  Pain dispense #30.   Leanora Cover, MD Electronically signed, 02/18/20
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
## 2020-02-18 NOTE — Anesthesia Procedure Notes (Signed)
Procedure Name: LMA Insertion Date/Time: 02/18/2020 12:10 PM Performed by: Montez Morita, Donique Hammonds W, CRNA Pre-anesthesia Checklist: Patient identified, Emergency Drugs available, Suction available and Patient being monitored Patient Re-evaluated:Patient Re-evaluated prior to induction Oxygen Delivery Method: Circle system utilized Preoxygenation: Pre-oxygenation with 100% oxygen Induction Type: IV induction Ventilation: Mask ventilation without difficulty LMA: LMA inserted LMA Size: 4.0 Number of attempts: 1 Placement Confirmation: positive ETCO2 Tube secured with: Tape Dental Injury: Teeth and Oropharynx as per pre-operative assessment

## 2020-02-18 NOTE — Op Note (Signed)
I assisted Surgeon(s) and Role:    * Betha Loa, MD - Primary    Cindee Salt, MD - Assisting on the Procedure(s): LEFT INDEX FINGER WOUND EXPLORATION NERVE REPAIR on 02/18/2020.  I provided assistance on this case as follows: Set up approach, exploration of flexor tendons arteries nerves, bringing in the operative microscope into the field, repair of the nerve, closure of the wounds, application dressings and splint.  Electronically signed by: Cindee Salt, MD Date: 02/18/2020 Time: 1:04 PM

## 2020-02-18 NOTE — Anesthesia Postprocedure Evaluation (Addendum)
Anesthesia Post Note  Patient: Michele Bell  Procedure(s) Performed: LEFT INDEX FINGER WOUND EXPLORATION (Left Finger) NERVE REPAIR (Left Finger)     Patient location during evaluation: PACU Anesthesia Type: General Level of consciousness: awake and alert Pain management: pain level controlled Vital Signs Assessment: post-procedure vital signs reviewed and stable Respiratory status: spontaneous breathing, nonlabored ventilation and respiratory function stable Cardiovascular status: blood pressure returned to baseline and stable Postop Assessment: no apparent nausea or vomiting Anesthetic complications: no    Last Vitals:  Vitals:   02/18/20 1330 02/18/20 1345  BP: 105/70 102/72  Pulse: 63 64  Resp: 17 17  Temp:    SpO2: 96% 95%    Last Pain:  Vitals:   02/18/20 1345  TempSrc:   PainSc: 0-No pain                 Lucretia Kern

## 2020-02-18 NOTE — Anesthesia Preprocedure Evaluation (Addendum)
Anesthesia Evaluation  Patient identified by MRN, date of birth, ID band Patient awake    Reviewed: Allergy & Precautions, NPO status , Patient's Chart, lab work & pertinent test results  History of Anesthesia Complications Negative for: history of anesthetic complications  Airway Mallampati: II  TM Distance: >3 FB Neck ROM: Full    Dental  (+) Teeth Intact   Pulmonary Current Smoker,    Pulmonary exam normal        Cardiovascular negative cardio ROS Normal cardiovascular exam     Neuro/Psych Anxiety Depression negative neurological ROS     GI/Hepatic negative GI ROS, (+)     substance abuse  marijuana use,   Endo/Other  negative endocrine ROS  Renal/GU negative Renal ROS  negative genitourinary   Musculoskeletal negative musculoskeletal ROS (+)   Abdominal   Peds  Hematology negative hematology ROS (+)   Anesthesia Other Findings   Reproductive/Obstetrics                             Anesthesia Physical Anesthesia Plan  ASA: II  Anesthesia Plan: General   Post-op Pain Management:    Induction: Intravenous  PONV Risk Score and Plan: 3 and Treatment may vary due to age or medical condition, Ondansetron, Dexamethasone and Midazolam  Airway Management Planned: LMA  Additional Equipment: None  Intra-op Plan:   Post-operative Plan:   Informed Consent: I have reviewed the patients History and Physical, chart, labs and discussed the procedure including the risks, benefits and alternatives for the proposed anesthesia with the patient or authorized representative who has indicated his/her understanding and acceptance.       Plan Discussed with:   Anesthesia Plan Comments:        Anesthesia Quick Evaluation

## 2020-02-19 ENCOUNTER — Encounter: Payer: Self-pay | Admitting: *Deleted

## 2020-02-26 DIAGNOSIS — S64491D Injury of digital nerve of left index finger, subsequent encounter: Secondary | ICD-10-CM | POA: Diagnosis not present

## 2020-02-26 DIAGNOSIS — M79645 Pain in left finger(s): Secondary | ICD-10-CM | POA: Diagnosis not present

## 2020-04-11 DIAGNOSIS — L905 Scar conditions and fibrosis of skin: Secondary | ICD-10-CM | POA: Diagnosis not present

## 2020-04-11 DIAGNOSIS — M25642 Stiffness of left hand, not elsewhere classified: Secondary | ICD-10-CM | POA: Diagnosis not present

## 2020-04-11 DIAGNOSIS — R52 Pain, unspecified: Secondary | ICD-10-CM | POA: Diagnosis not present

## 2020-04-11 DIAGNOSIS — S64491D Injury of digital nerve of left index finger, subsequent encounter: Secondary | ICD-10-CM | POA: Diagnosis not present

## 2020-04-22 DIAGNOSIS — Z20822 Contact with and (suspected) exposure to covid-19: Secondary | ICD-10-CM | POA: Diagnosis not present

## 2020-04-29 ENCOUNTER — Encounter: Payer: Self-pay | Admitting: Physician Assistant

## 2020-04-29 ENCOUNTER — Other Ambulatory Visit: Payer: Self-pay | Admitting: Physician Assistant

## 2020-04-29 DIAGNOSIS — F32A Depression, unspecified: Secondary | ICD-10-CM

## 2020-04-29 DIAGNOSIS — F329 Major depressive disorder, single episode, unspecified: Secondary | ICD-10-CM

## 2020-04-29 DIAGNOSIS — F419 Anxiety disorder, unspecified: Secondary | ICD-10-CM

## 2020-05-02 MED ORDER — BUSPIRONE HCL 7.5 MG PO TABS: 7.5000 mg | ORAL_TABLET | Freq: Two times a day (BID) | ORAL | 1 refills | Status: DC

## 2020-08-09 ENCOUNTER — Encounter: Payer: Self-pay | Admitting: Physician Assistant

## 2020-08-09 DIAGNOSIS — F32A Depression, unspecified: Secondary | ICD-10-CM

## 2020-08-09 DIAGNOSIS — F419 Anxiety disorder, unspecified: Secondary | ICD-10-CM

## 2020-08-09 MED ORDER — SERTRALINE HCL 50 MG PO TABS: 50.0000 mg | ORAL_TABLET | Freq: Every day | ORAL | 0 refills | Status: DC

## 2020-08-09 NOTE — Telephone Encounter (Signed)
Can we call and schedule for anxiety follow up? Thanks!

## 2020-08-10 ENCOUNTER — Other Ambulatory Visit: Payer: Self-pay

## 2020-08-10 ENCOUNTER — Ambulatory Visit (INDEPENDENT_AMBULATORY_CARE_PROVIDER_SITE_OTHER): Payer: 59 | Admitting: Physician Assistant

## 2020-08-10 ENCOUNTER — Other Ambulatory Visit (HOSPITAL_COMMUNITY)
Admission: RE | Admit: 2020-08-10 | Discharge: 2020-08-10 | Disposition: A | Discharge status: Home / Self Care | Payer: 59 | Source: Ambulatory Visit | Attending: Physician Assistant | Admitting: Physician Assistant

## 2020-08-10 ENCOUNTER — Encounter: Payer: Self-pay | Admitting: Physician Assistant

## 2020-08-10 VITALS — BP 110/81 | HR 74 | Temp 98.6°F | Ht 63.0 in | Wt 123.3 lb

## 2020-08-10 DIAGNOSIS — Z124 Encounter for screening for malignant neoplasm of cervix: Secondary | ICD-10-CM | POA: Insufficient documentation

## 2020-08-10 DIAGNOSIS — F32A Depression, unspecified: Secondary | ICD-10-CM

## 2020-08-10 DIAGNOSIS — F419 Anxiety disorder, unspecified: Secondary | ICD-10-CM | POA: Diagnosis not present

## 2020-08-10 DIAGNOSIS — R69 Illness, unspecified: Secondary | ICD-10-CM | POA: Diagnosis not present

## 2020-08-10 DIAGNOSIS — Z0000 Encounter for general adult medical examination without abnormal findings: Secondary | ICD-10-CM | POA: Insufficient documentation

## 2020-08-10 MED ORDER — SERTRALINE HCL 100 MG PO TABS: 100.0000 mg | ORAL_TABLET | Freq: Every day | ORAL | 1 refills | Status: AC

## 2020-08-10 MED ORDER — CLONAZEPAM 0.5 MG PO TABS: 0.5000 mg | ORAL_TABLET | Freq: Every day | ORAL | 0 refills | Status: AC | PRN
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
## 2020-08-15 LAB — CYTOLOGY - PAP
Chlamydia: NEGATIVE
Comment: NEGATIVE
Comment: NORMAL
Diagnosis: NEGATIVE
Neisseria Gonorrhea: NEGATIVE

## 2020-09-21 ENCOUNTER — Encounter: Payer: Self-pay | Admitting: Physician Assistant

## 2020-09-21 ENCOUNTER — Ambulatory Visit: Payer: 59 | Admitting: Physician Assistant

## 2020-09-21 ENCOUNTER — Telehealth (INDEPENDENT_AMBULATORY_CARE_PROVIDER_SITE_OTHER): Payer: 59 | Admitting: Physician Assistant

## 2020-09-21 DIAGNOSIS — R69 Illness, unspecified: Secondary | ICD-10-CM | POA: Diagnosis not present

## 2020-09-21 DIAGNOSIS — F419 Anxiety disorder, unspecified: Secondary | ICD-10-CM | POA: Diagnosis not present

## 2020-09-21 DIAGNOSIS — F32A Depression, unspecified: Secondary | ICD-10-CM | POA: Diagnosis not present

## 2020-09-21 MED ORDER — BUSPIRONE HCL 15 MG PO TABS: 15.0000 mg | ORAL_TABLET | Freq: Two times a day (BID) | ORAL | 1 refills | Status: AC

## 2020-09-21 NOTE — Progress Notes (Signed)
MyChart Video Visit    Virtual Visit via Video Note   This visit type was conducted due to national recommendations for restrictions regarding the COVID-19 Pandemic (e.g. social distancing) in an effort to limit this patient's exposure and mitigate transmission in our community. This patient is at least at moderate risk for complications without adequate follow up. This format is felt to be most appropriate for this patient at this time. Physical exam was limited by quality of the video and audio technology used for the visit.   Patient location: Home Provider location: Office   I discussed the limitations of evaluation and management by telemedicine and the availability of in person appointments. The patient expressed understanding and agreed to proceed.  Patient: Michele Bell   DOB: 1996-06-02   24 y.o. Female  MRN: [id number] Visit Date: 09/21/2020  Today's healthcare provider: Trey Sailors, PA-C   Chief Complaint  Patient presents with  . Anxiety  . Depression  I,Porsha C McClurkin,acting as a scribe for Trey Sailors, PA-C.,have documented all relevant documentation on the behalf of Trey Sailors, PA-C,as directed by  Trey Sailors, PA-C while in the presence of Trey Sailors, PA-C.  Subjective    HPI  Anxiety & Depression, Follow-up  She was last seen for anxiety and depression 6 weeks ago. Changes made at last visit include increased Zoloft to 100 mg and added Clonazepam  0.5 MG tablet. Feels a bit better regarding depression but feels anxiety is still uncontrolled. She is currently seeing counselor.  She reports good compliance with treatment. She reports fair to poor tolerance of treatment. She is not having side effects.   She feels her anxiety is moderate and Unchanged since last visit.  Symptoms: No chest pain No difficulty concentrating  No dizziness No fatigue  No feelings of losing control Yes insomnia  Yes irritable No  palpitations  Yes panic attacks Yes racing thoughts  No shortness of breath No sweating  No tremors/shakes    GAD-7 Results GAD-7 Generalized Anxiety Disorder Screening Tool 09/04/2019  1. Feeling Nervous, Anxious, or on Edge 3  2. Not Being Able to Stop or Control Worrying 3  3. Worrying Too Much About Different Things 2  4. Trouble Relaxing 3  5. Being So Restless it's Hard To Sit Still 0  6. Becoming Easily Annoyed or Irritable 2  7. Feeling Afraid As If Something Awful Might Happen 2  Total GAD-7 Score 15  Difficulty At Work, Home, or Getting  Along With Others? Very difficult    PHQ-9 Scores PHQ9 SCORE ONLY 08/10/2020 07/24/2019  PHQ-9 Total Score 19 14    ---------------------------------------------------------------------------------------------------     Medications: Outpatient Medications Prior to Visit  Medication Sig  . busPIRone (BUSPAR) 7.5 MG tablet Take 1 tablet (7.5 mg total) by mouth 2 (two) times daily.  . clonazePAM (KLONOPIN) 0.5 MG tablet Take 1 tablet (0.5 mg total) by mouth daily as needed for anxiety.  Marland Kitchen ethynodiol-ethinyl estradiol [place] 1/35) 1-35 MG-MCG tablet Take 1 tablet by mouth daily.  . hydrOXYzine (ATARAX/VISTARIL) 10 MG tablet Take 10 mg by mouth 3 (three) times daily as needed.  . sertraline (ZOLOFT) 100 MG tablet Take 1 tablet (100 mg total) by mouth daily.  . [DISCONTINUED] azithromycin (ZITHROMAX Z-PAK) 250 MG tablet Take 2 tablets (500 mg) on  Day 1,  followed by 1 tablet (250 mg) once daily on Days 2 through 5. (Patient not taking: Reported on 08/10/2020)  . [DISCONTINUED]  lactulose (CHRONULAC) 10 GM/15ML solution Take 15 mLs (10 g total) by mouth 3 (three) times daily. PRN (Patient not taking: Reported on 08/10/2020)  . [DISCONTINUED] oxyCODONE (ROXICODONE) 5 MG immediate release tablet 1-2 tabs PO q6 hours prn pain (Patient not taking: Reported on 08/10/2020)   No facility-administered medications prior to visit.    Review of  Systems  Constitutional: Negative.   Respiratory: Negative.   Cardiovascular: Negative.   Hematological: Negative.       Objective    There were no vitals taken for this visit.   Physical Exam Constitutional:      Appearance: Normal appearance.  Pulmonary:     Effort: Pulmonary effort is normal. No respiratory distress.  Neurological:     Mental Status: She is alert.  Psychiatric:        Mood and Affect: Mood normal.        Behavior: Behavior normal.        Assessment & Plan     1. Anxiety and depression  - busPIRone (BUSPAR) 15 MG tablet; Take 1 tablet (15 mg total) by mouth 2 (two) times daily.  Dispense: 180 tablet; Refill: 1   No follow-ups on file.     I discussed the assessment and treatment plan with the patient. The patient was provided an opportunity to ask questions and all were answered. The patient agreed with the plan and demonstrated an understanding of the instructions.   The patient was advised to call back or seek an in-person evaluation if the symptoms worsen or if the condition fails to improve as anticipated.   ITrey Sailors, PA-C, have reviewed all documentation for this visit. The documentation on 09/28/20 for the exam, diagnosis, procedures, and orders are all accurate and complete.  The entirety of the information documented in the History of Present Illness, Review of Systems and Physical Exam were personally obtained by me. Portions of this information were initially documented by [facility] and reviewed by me for thoroughness and accuracy.    Maryella Shivers [facility] [phone] (phone) [phone] (fax)  [facility]

## 2020-09-21 NOTE — Patient Instructions (Signed)

## 2020-09-21 NOTE — Telephone Encounter (Signed)
Looks like patient is trying to cancel her appointment? Would need appt to discuss further medication changes or she can make today's visit virtual.

## 2020-09-22 NOTE — Telephone Encounter (Signed)
Patient appointment was changed on yesterday,09/21/2020 to MyChart.

## 2020-10-21 ENCOUNTER — Encounter: Payer: Self-pay | Admitting: Physician Assistant

## 2020-10-21 DIAGNOSIS — Z30011 Encounter for initial prescription of contraceptive pills: Secondary | ICD-10-CM

## 2020-10-21 MED ORDER — ETHYNODIOL DIAC-ETH ESTRADIOL 1-35 MG-MCG PO TABS: 1.0000 | ORAL_TABLET | Freq: Every day | ORAL | 3 refills | Status: AC

## 2020-10-26 ENCOUNTER — Ambulatory Visit (INDEPENDENT_AMBULATORY_CARE_PROVIDER_SITE_OTHER): Payer: 59 | Admitting: Physician Assistant

## 2020-10-26 ENCOUNTER — Encounter: Payer: Self-pay | Admitting: Physician Assistant

## 2020-10-26 ENCOUNTER — Other Ambulatory Visit: Payer: Self-pay

## 2020-10-26 VITALS — BP 106/77 | HR 78 | Temp 98.4°F | Wt 123.6 lb

## 2020-10-26 DIAGNOSIS — Z3041 Encounter for surveillance of contraceptive pills: Secondary | ICD-10-CM | POA: Diagnosis not present

## 2020-10-26 DIAGNOSIS — Z23 Encounter for immunization: Secondary | ICD-10-CM

## 2020-10-26 DIAGNOSIS — F32A Depression, unspecified: Secondary | ICD-10-CM

## 2020-10-26 DIAGNOSIS — F419 Anxiety disorder, unspecified: Secondary | ICD-10-CM

## 2020-10-26 DIAGNOSIS — R69 Illness, unspecified: Secondary | ICD-10-CM | POA: Diagnosis not present

## 2020-10-26 NOTE — Patient Instructions (Signed)

## 2020-10-26 NOTE — Progress Notes (Signed)
Established patient visit   Patient: Michele Bell   DOB: [birth year]   24 y.o. Female  MRN: [id number] Visit Date: 10/26/2020  Today's healthcare provider: Trey Sailors, PA-C   Chief Complaint  Patient presents with  . Depression   Subjective    HPI   She plans to travel via RV.   Anxiety, Follow-up  She was last seen for anxiety 1 months ago. Changes made at last visit include changed busPIRone (BUSPAR) 15 MG tablet; Take 1 tablet (15 mg total) by mouth 2 (two) times daily. She continues to take zoloft 100 mg daily.    She reports good compliance with treatment. She reports good tolerance of treatment. She is having side effects. Night sweats  She feels her anxiety is mild and Improved since last visit.  Symptoms: No chest pain No difficulty concentrating  No dizziness No fatigue  No feelings of losing control No insomnia  Yes irritable No palpitations  No panic attacks No racing thoughts  No shortness of breath No sweating  No tremors/shakes    GAD-7 Results GAD-7 Generalized Anxiety Disorder Screening Tool 10/26/2020 09/21/2020 09/04/2019  1. Feeling Nervous, Anxious, or on Edge 1 3 3   2. Not Being Able to Stop or Control Worrying 0 3 3  3. Worrying Too Much About Different Things 0 3 2  4. Trouble Relaxing 1 1 3   5. Being So Restless it's Hard To Sit Still 1 2 0  6. Becoming Easily Annoyed or Irritable 2 2 2   7. Feeling Afraid As If Something Awful Might Happen 0 3 2  Total GAD-7 Score 5 17 15   Difficulty At Work, Home, or Getting  Along With Others? Not difficult at all Very difficult Very difficult    PHQ-9 Scores PHQ9 SCORE ONLY 08/10/2020 07/24/2019  PHQ-9 Total Score 19 14    ---------------------------------------------------------------------------------------------------      Medications: Outpatient Medications Prior to Visit  Medication Sig  . busPIRone (BUSPAR) 15 MG tablet Take 1 tablet (15 mg total) by mouth 2 (two) times  daily.  . clonazePAM (KLONOPIN) 0.5 MG tablet Take 1 tablet (0.5 mg total) by mouth daily as needed for anxiety.  ethynodiol-ethinyl estradiol 1/35) 1-35 MG-MCG tablet Take 1 tablet by mouth daily.  . hydrOXYzine (ATARAX/VISTARIL) 10 MG tablet Take 10 mg by mouth 3 (three) times daily as needed.  . sertraline (ZOLOFT) 100 MG tablet Take 1 tablet (100 mg total) by mouth daily.   No facility-administered medications prior to visit.    Review of Systems     Objective    BP 106/77 (BP Location: Left Arm, Patient Position: Sitting, Cuff Size: Large)   Pulse 78   Temp 98.4 F (36.9 C) (Oral)   Wt 123 lb 9.6 oz (56.1 kg)   LMP 09/30/2020   SpO2 99%   BMI 21.89 kg/m     Physical Exam Constitutional:      Appearance: Normal appearance.  Cardiovascular:     Rate and Rhythm: Normal rate and regular rhythm.     Heart sounds: Normal heart sounds.  Pulmonary:     Effort: Pulmonary effort is normal.     Breath sounds: Normal breath sounds.  Skin:    General: Skin is warm and dry.  Neurological:     Mental Status: She is alert and oriented to person, place, and time. Mental status is at baseline.  Psychiatric:        Mood and Affect: Mood normal.  Behavior: Behavior normal.       No results found for any visits on 10/26/20.  Assessment & Plan    1. Anxiety and depression  Improving with increase of buspar to 15 mg BID. She is having some night sweats which I suspect are due to zoloft 100 mg daily. She would like to continue these medications.   2. Encounter for surveillance of contraceptive pills  Continue OCP.    Return in about 1 year (around 10/26/2021) for 07/2021 .      ITrey Sailors, PA-C, have reviewed all documentation for this visit. The documentation on 10/26/20 for the exam, diagnosis, procedures, and orders are all accurate and complete.  The entirety of the information documented in the History of Present Illness, Review of Systems and  Physical Exam were personally obtained by me. Portions of this information were initially documented by [facility] and reviewed by me for thoroughness and accuracy.     Maryella Shivers  [facility] [phone] (phone) [phone] (fax)  [facility]

## 2020-11-02 ENCOUNTER — Telehealth: Payer: 59 | Admitting: Physician Assistant

## 2020-11-04 ENCOUNTER — Emergency Department
Admission: EM | Admit: 2020-11-04 | Discharge: 2020-11-05 | Disposition: A | Discharge status: Home / Self Care | Payer: 59 | Attending: Student in an Organized Health Care Education/Training Program | Admitting: Student in an Organized Health Care Education/Training Program

## 2020-11-04 ENCOUNTER — Other Ambulatory Visit: Payer: Self-pay

## 2020-11-04 ENCOUNTER — Emergency Department: Payer: 59

## 2020-11-04 DIAGNOSIS — Z79899 Other long term (current) drug therapy: Secondary | ICD-10-CM | POA: Diagnosis not present

## 2020-11-04 DIAGNOSIS — R69 Illness, unspecified: Secondary | ICD-10-CM | POA: Diagnosis not present

## 2020-11-04 DIAGNOSIS — T543X1A Toxic effect of corrosive alkalis and alkali-like substances, accidental (unintentional), initial encounter: Secondary | ICD-10-CM | POA: Diagnosis not present

## 2020-11-04 DIAGNOSIS — Z0389 Encounter for observation for other suspected diseases and conditions ruled out: Secondary | ICD-10-CM | POA: Diagnosis not present

## 2020-11-04 DIAGNOSIS — Z91040 Latex allergy status: Secondary | ICD-10-CM | POA: Diagnosis not present

## 2020-11-04 DIAGNOSIS — S299XXA Unspecified injury of thorax, initial encounter: Secondary | ICD-10-CM | POA: Diagnosis not present

## 2020-11-04 DIAGNOSIS — T594X1A Toxic effect of chlorine gas, accidental (unintentional), initial encounter: Secondary | ICD-10-CM | POA: Insufficient documentation

## 2020-11-04 DIAGNOSIS — F17290 Nicotine dependence, other tobacco product, uncomplicated: Secondary | ICD-10-CM | POA: Insufficient documentation

## 2020-11-04 DIAGNOSIS — T5991XA Toxic effect of unspecified gases, fumes and vapors, accidental (unintentional), initial encounter: Secondary | ICD-10-CM

## 2020-11-04 DIAGNOSIS — R0602 Shortness of breath: Secondary | ICD-10-CM | POA: Diagnosis present

## 2020-11-04 LAB — BASIC METABOLIC PANEL
Anion gap: 10 (ref 5–15)
BUN: 10 mg/dL (ref 6–20)
CO2: 25 mmol/L (ref 22–32)
Calcium: 9.3 mg/dL (ref 8.9–10.3)
Chloride: 103 mmol/L (ref 98–111)
Creatinine, Ser: 0.69 mg/dL (ref 0.44–1.00)
GFR, Estimated: >60 mL/min (ref >60)
Glucose, Bld: 94 mg/dL (ref 70–99)
Potassium: 2.8 mmol/L — ABNORMAL LOW (ref 3.5–5.1)
Sodium: 138 mmol/L (ref 135–145)

## 2020-11-04 LAB — CBC WITH DIFFERENTIAL/PLATELET
Abs Immature Granulocytes: 0.02 10*3/uL (ref 0.00–0.07)
Basophils Absolute: 0.1 10*3/uL (ref 0.0–0.1)
Basophils Relative: 1 %
Eosinophils Absolute: 1.2 10*3/uL — ABNORMAL HIGH (ref 0.0–0.5)
Eosinophils Relative: 12 %
HCT: 42 % (ref 36.0–46.0)
Hemoglobin: 13.7 g/dL (ref 12.0–15.0)
Immature Granulocytes: 0 %
Lymphocytes Relative: 30 %
Lymphs Abs: 3 10*3/uL (ref 0.7–4.0)
MCH: 29.1 pg (ref 26.0–34.0)
MCHC: 32.6 g/dL (ref 30.0–36.0)
MCV: 89.4 fL (ref 80.0–100.0)
Monocytes Absolute: 0.6 10*3/uL (ref 0.1–1.0)
Monocytes Relative: 6 %
Neutro Abs: 5.2 10*3/uL (ref 1.7–7.7)
Neutrophils Relative %: 51 %
Platelets: 329 10*3/uL (ref 150–400)
RBC: 4.7 MIL/uL (ref 3.87–5.11)
RDW: 13.5 % (ref 11.5–15.5)
WBC: 10.1 10*3/uL (ref 4.0–10.5)
nRBC: 0 % (ref 0.0–0.2)

## 2020-11-04 MED ORDER — ALBUTEROL SULFATE HFA 108 (90 BASE) MCG/ACT IN AERS
2.0000 | INHALATION_SPRAY | Freq: Once | RESPIRATORY_TRACT | Status: AC
  Administered 2020-11-05: 2 via RESPIRATORY_TRACT
  Filled 2020-11-04: qty 6.7

## 2020-11-04 MED ORDER — METHYLPREDNISOLONE SODIUM SUCC 125 MG IJ SOLR
125.0000 mg | Freq: Once | INTRAMUSCULAR | Status: AC
  Administered 2020-11-04: 125 mg via INTRAVENOUS
  Filled 2020-11-04: qty 2

## 2020-11-04 MED ORDER — ALBUTEROL SULFATE (2.5 MG/3ML) 0.083% IN NEBU
2.5000 mg | INHALATION_SOLUTION | Freq: Once | RESPIRATORY_TRACT | Status: AC
  Administered 2020-11-04: 2.5 mg via RESPIRATORY_TRACT
  Filled 2020-11-04: qty 3

## 2020-11-04 MED ORDER — ALBUTEROL SULFATE HFA 108 (90 BASE) MCG/ACT IN AERS
2.0000 | INHALATION_SPRAY | Freq: Four times a day (QID) | RESPIRATORY_TRACT | 2 refills | Status: AC | PRN
Start: 2020-11-04 — End: ?
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
## 2020-11-04 NOTE — ED Notes (Signed)
POC urine pregnacy test negative
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
## 2020-11-05 LAB — POC URINE PREG, ED: Preg Test, Ur: NEGATIVE

## 2020-11-05 NOTE — ED Notes (Signed)
ED Provider [place] at bedside.  

## 2020-11-05 NOTE — Discharge Instructions (Signed)
Use the inhaler that we provided to you 2 puffs every 4 hours as needed for wheezing.  Return to the emergency room for chest pain, vomiting, shortness of breath, difficulty breathing.  Otherwise follow-up with your primary care doctor.  Make sure to not mix ammonia and bleach anymore.

## 2020-11-05 NOTE — ED Notes (Signed)
Inhaler requested from pharmacy

## 2020-11-05 NOTE — ED Provider Notes (Signed)
Patient feels back to baseline. No longer wheezing, no difficulty breathing, no chest pain, no nausea or vomiting.  Has been monitored for 4.5 hours post arrival with full resolution of her symptoms.  Patient be discharged home with an inhaler and she was instructed to use it for any difficulty breathing or wheezing.  Otherwise I discussed strict return precautions and follow-up with PCP.  Again reminded patient not to mix ammonia and bleach anymore.   Don Perking, [place], MD 11/05/20 [phone]

## 2020-11-05 NOTE — ED Notes (Addendum)
Poison control - Raynelle Fanning - called for update on patient condition. Recommended monitor until symptoms resolve. No further instructions given at this time.

## 2020-11-07 LAB — POC URINE PREG, ED: Preg Test, Ur: NEGATIVE

## 2021-01-02 ENCOUNTER — Other Ambulatory Visit: Payer: Self-pay | Admitting: Physician Assistant

## 2021-01-02 NOTE — Telephone Encounter (Signed)
Requested medication (s) are due for refill today: no  Requested medication (s) are on the active medication list: yes   Last refill:  09/04/2019  Future visit scheduled: no  Notes to clinic:  This script is expired  Review for continued use and refill    Requested Prescriptions  Pending Prescriptions Disp Refills   hydrOXYzine (ATARAX/VISTARIL) 10 MG tablet [Pharmacy Med Name: hydrOXYzine HCl 10 MG Oral Tablet] 90 tablet 0    Sig: Take 1 tablet by mouth three times daily as needed      Ear, Nose, and Throat:  Antihistamines Passed - 01/02/2021  1:52 PM      Passed - Valid encounter within last 12 months    Recent Outpatient Visits           2 months ago Anxiety and depression   [facility] [place], [place], PA-C   3 months ago Anxiety and depression   [facility] [place], [place], [place]   4 months ago Annual physical exam   [facility] [place], Lavella Hammock, [place]   1 year ago Cystitis   [facility] [place], Lavella Hamm[place], [place]   1 year ago Anxiety and depression   [facility] [place], [place], [place]

## 2021-03-24 IMAGING — CT CT ANGIO CHEST
2 of 6 series · 18 of 46 positions shown · IV contrast (APPLIED)
Comparison: Chest x-ray dated 02/11/2020

CLINICAL DATA: Acute onset of chest pain this morning.

EXAM:
CT ANGIOGRAPHY CHEST WITH CONTRAST
TECHNIQUE: Multidetector CT imaging of the chest was performed using the
standard protocol during bolus administration of intravenous
contrast. Multiplanar CT image reconstructions and MIPs were
obtained to evaluate the vascular anatomy.
CONTRAST:  75mL OMNIPAQUE IOHEXOL 350 MG/ML SOLN

[Series 5: thins · axial · 0.60mm/px · z∈[-456,-216]mm · 15 of 264 slices shown]
[im 12/264  lung]
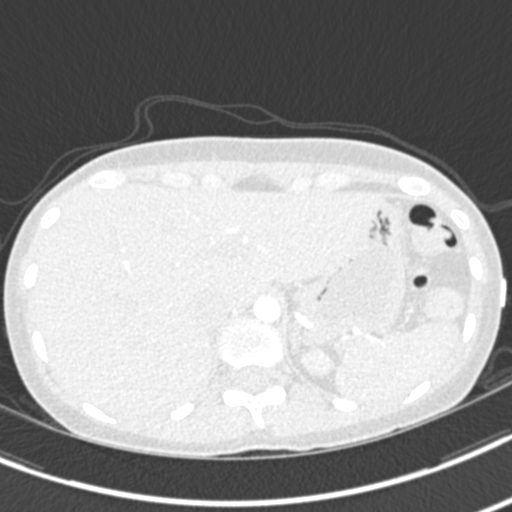
[im 35/264  soft-tissue]
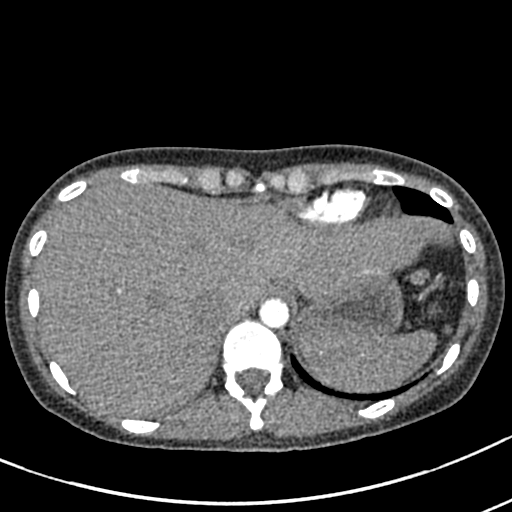
[im 46/264  lung]
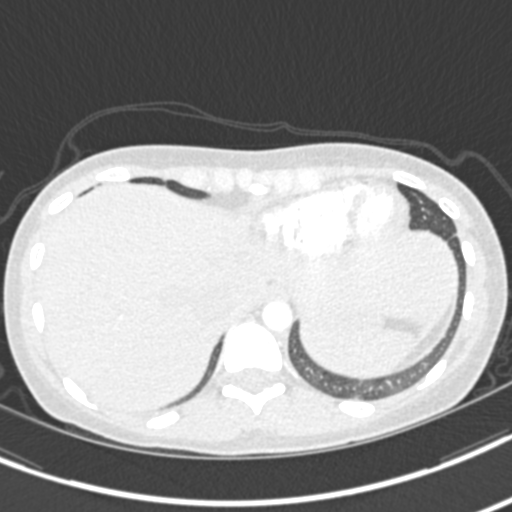
[im 69/264  soft-tissue]
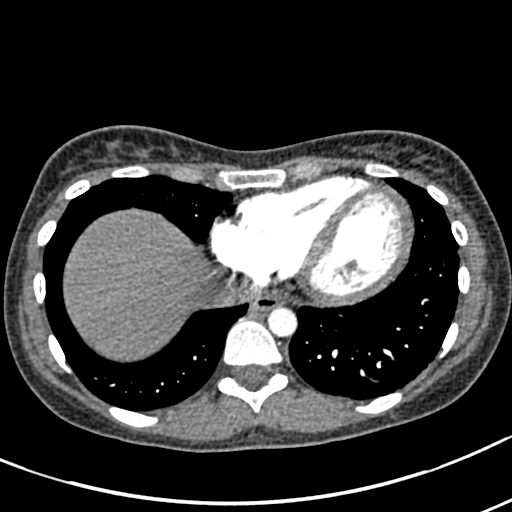
[im 81/264  lung]
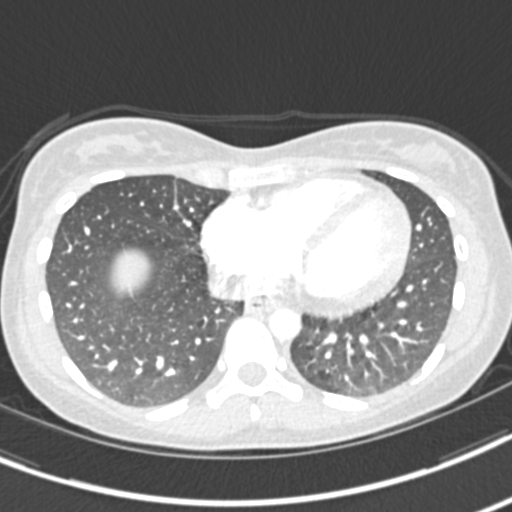
[im 103/264  soft-tissue]
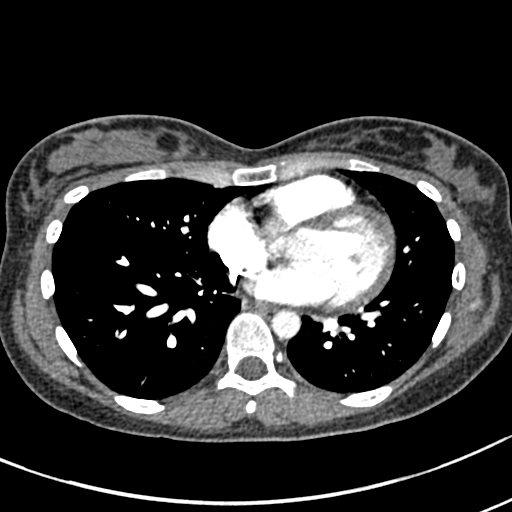
[im 115/264  lung]
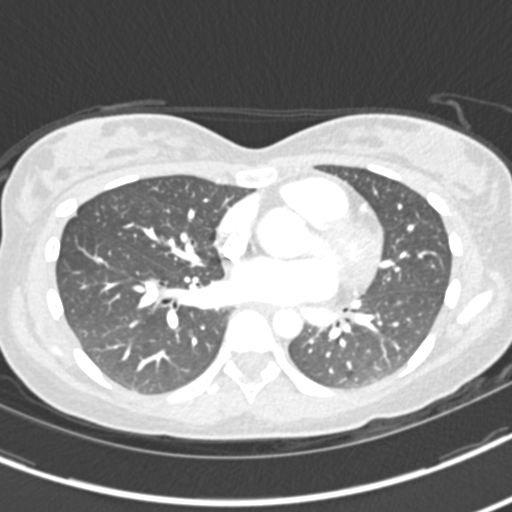
[im 138/264  soft-tissue]
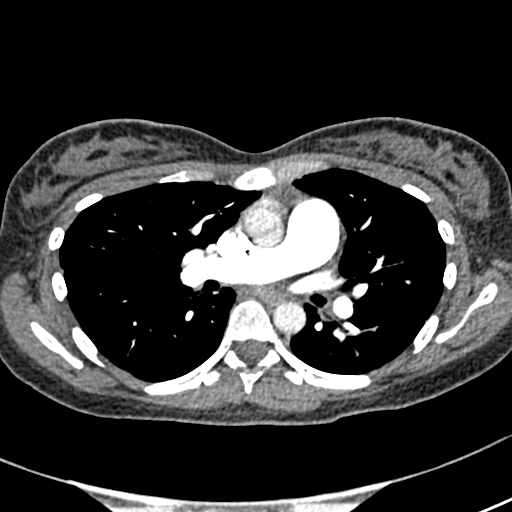
[im 149/264  lung]
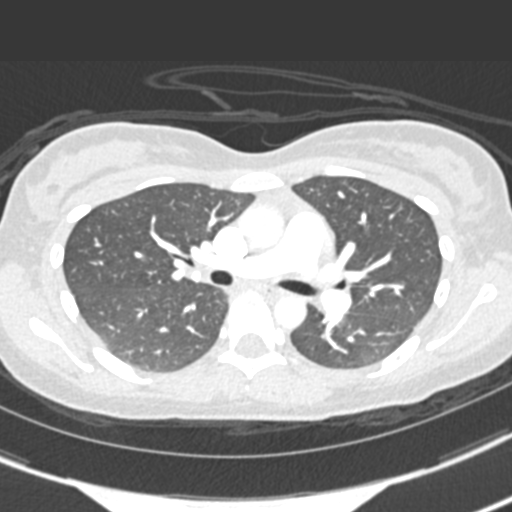
[im 161/264  soft-tissue]
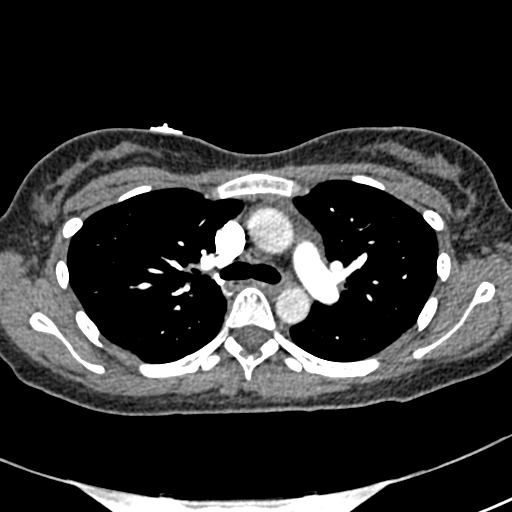
[im 183/264  lung]
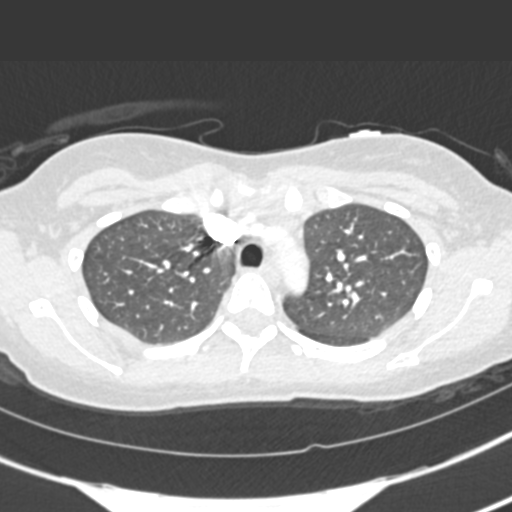
[im 195/264  soft-tissue]
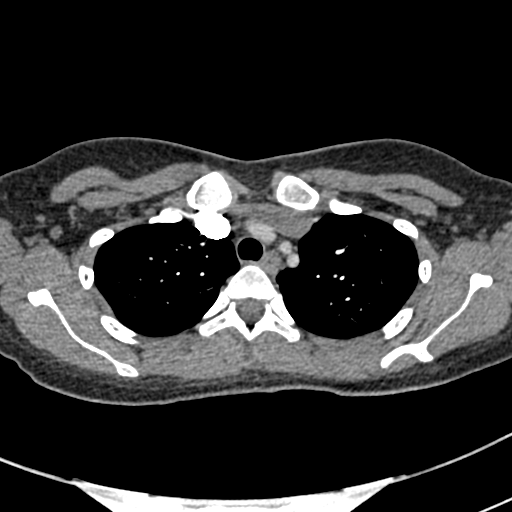
[im 218/264  lung]
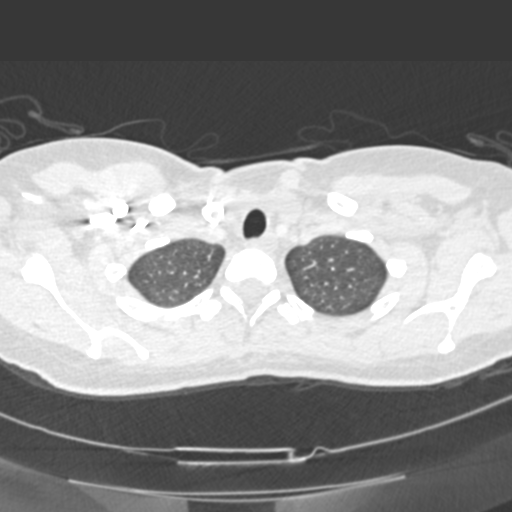
[im 229/264  soft-tissue]
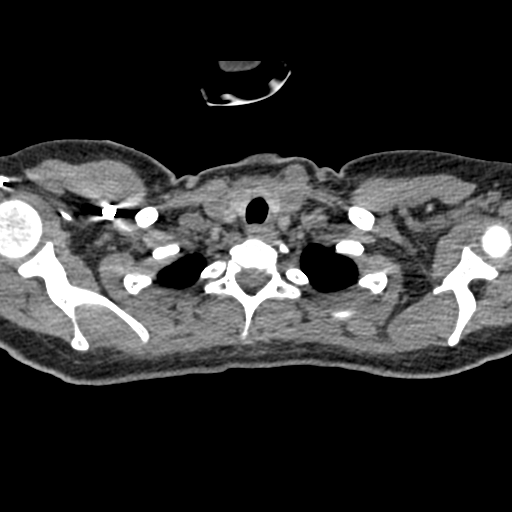
[im 252/264  lung]
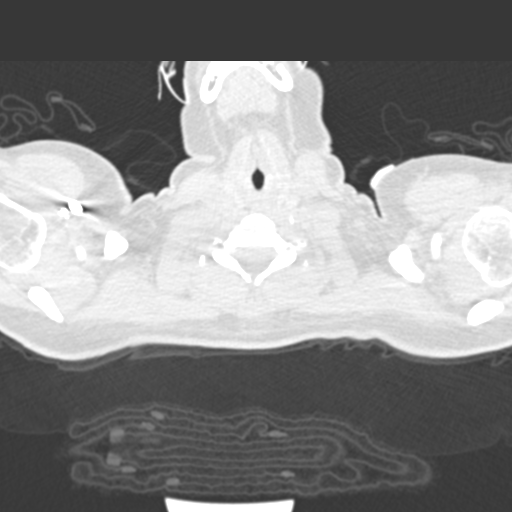

[Series 7: coronal mpr · coronal · 0.52mm/px · 3 of 67 slices shown]
[im 17/67  soft-tissue]
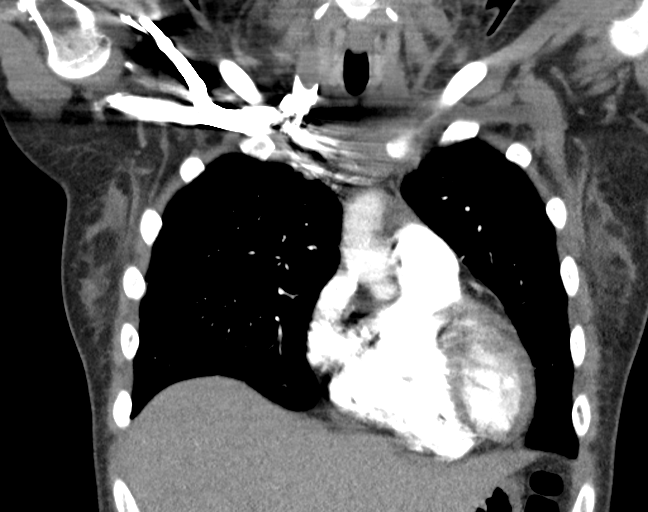
[im 34/67  soft-tissue]
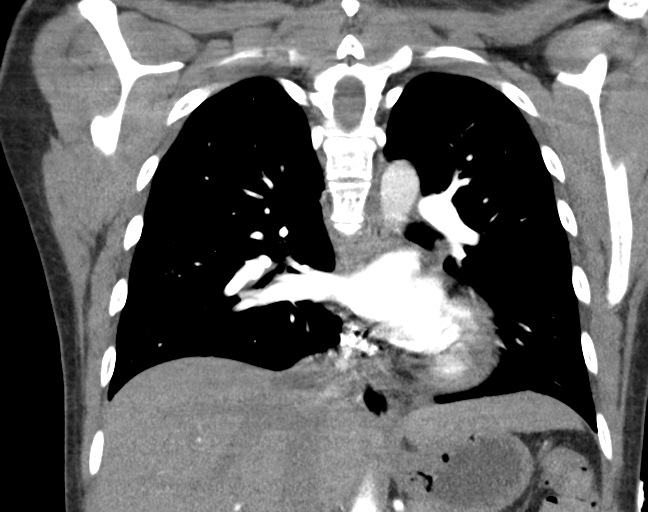
[im 50/67  soft-tissue]
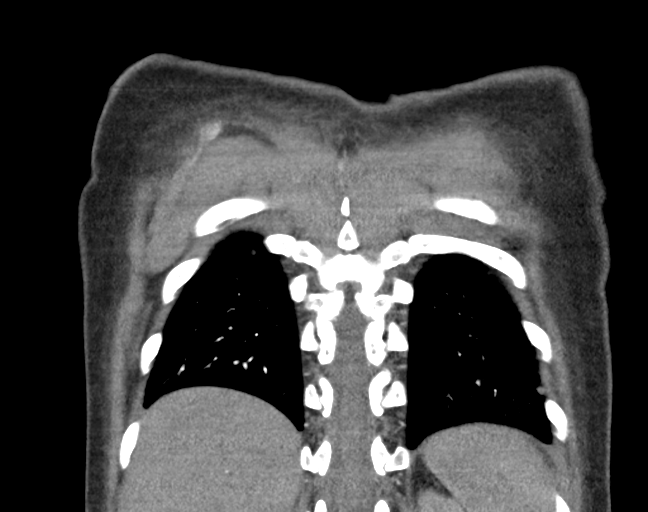

[18 of 46 positions shown; findings below may reference images not displayed]

FINDINGS: Cardiovascular: Satisfactory opacification of the pulmonary arteries
to the segmental level. No evidence of pulmonary embolism. Normal
heart size. No pericardial effusion.

Mediastinum/Nodes: No enlarged mediastinal, hilar, or axillary lymph
nodes. Thyroid gland, trachea, and esophagus demonstrate no
significant findings.

Lungs/Pleura: Lungs are clear. No pleural effusion or pneumothorax.

Upper Abdomen: No acute abnormality.

Musculoskeletal: No chest wall abnormality. No acute or significant
osseous findings.

Review of the MIP images confirms the above findings.
IMPRESSION: Normal exam.

## 2021-04-04 ENCOUNTER — Ambulatory Visit: Payer: 59 | Admitting: Family Medicine

## 2021-04-05 DIAGNOSIS — M79645 Pain in left finger(s): Secondary | ICD-10-CM | POA: Diagnosis not present

## 2021-04-05 DIAGNOSIS — L03012 Cellulitis of left finger: Secondary | ICD-10-CM | POA: Diagnosis not present

## 2021-04-05 DIAGNOSIS — S6992XA Unspecified injury of left wrist, hand and finger(s), initial encounter: Secondary | ICD-10-CM | POA: Diagnosis not present

## 2021-04-13 ENCOUNTER — Ambulatory Visit: Payer: 59 | Admitting: Family Medicine

## 2021-05-15 ENCOUNTER — Ambulatory Visit: Payer: 59 | Admitting: Family Medicine

## 2021-11-14 DIAGNOSIS — H60501 Unspecified acute noninfective otitis externa, right ear: Secondary | ICD-10-CM | POA: Diagnosis not present

## 2021-11-28 DIAGNOSIS — R1013 Epigastric pain: Secondary | ICD-10-CM | POA: Diagnosis not present

## 2021-11-28 DIAGNOSIS — R69 Illness, unspecified: Secondary | ICD-10-CM | POA: Diagnosis not present
